# Patient Record
Sex: Female | Born: 1985
Health system: Southern US, Community
[De-identification: ages and names within clinical notes are randomized; demographics above are authoritative.]

## PROBLEM LIST (undated history)

## (undated) DIAGNOSIS — IMO0002 Reserved for concepts with insufficient information to code with codable children: Secondary | ICD-10-CM

## (undated) DIAGNOSIS — I428 Other cardiomyopathies: Secondary | ICD-10-CM

## (undated) DIAGNOSIS — I5022 Chronic systolic (congestive) heart failure: Secondary | ICD-10-CM

## (undated) DIAGNOSIS — B019 Varicella without complication: Secondary | ICD-10-CM

## (undated) DIAGNOSIS — I119 Hypertensive heart disease without heart failure: Secondary | ICD-10-CM

## (undated) DIAGNOSIS — I2510 Atherosclerotic heart disease of native coronary artery without angina pectoris: Secondary | ICD-10-CM

## (undated) DIAGNOSIS — J40 Bronchitis, not specified as acute or chronic: Secondary | ICD-10-CM

## (undated) DIAGNOSIS — I10 Essential (primary) hypertension: Secondary | ICD-10-CM

## (undated) HISTORY — DX: Morbid (severe) obesity due to excess calories: E66.01

## (undated) HISTORY — DX: Hypertensive heart disease without heart failure: I11.9

## (undated) HISTORY — PX: FINGER SURGERY: SHX640

## (undated) HISTORY — DX: Chronic systolic (congestive) heart failure: I50.22

## (undated) HISTORY — DX: Varicella without complication: B01.9

## (undated) HISTORY — DX: Atherosclerotic heart disease of native coronary artery without angina pectoris: I25.10

## (undated) HISTORY — DX: Other cardiomyopathies: I42.8

## (undated) HISTORY — DX: Bronchitis, not specified as acute or chronic: J40

---

## 2009-08-05 ENCOUNTER — Inpatient Hospital Stay (HOSPITAL_COMMUNITY): Admission: AD | Admit: 2009-08-05 | Discharge: 2009-08-05 | Payer: Self-pay | Admitting: Obstetrics

## 2009-08-18 ENCOUNTER — Inpatient Hospital Stay (HOSPITAL_COMMUNITY): Admission: AD | Admit: 2009-08-18 | Discharge: 2009-08-18 | Payer: Self-pay | Admitting: Obstetrics & Gynecology

## 2009-08-25 ENCOUNTER — Inpatient Hospital Stay (HOSPITAL_COMMUNITY): Admission: AD | Admit: 2009-08-25 | Discharge: 2009-08-25 | Payer: Self-pay | Admitting: Family Medicine

## 2009-09-05 ENCOUNTER — Ambulatory Visit (HOSPITAL_COMMUNITY): Admission: RE | Admit: 2009-09-05 | Discharge: 2009-09-05 | Payer: Self-pay | Admitting: Obstetrics & Gynecology

## 2011-02-01 ENCOUNTER — Inpatient Hospital Stay (HOSPITAL_COMMUNITY)
Admission: AD | Admit: 2011-02-01 | Discharge: 2011-02-01 | Disposition: A | Discharge status: Home / Self Care | Payer: Medicaid Other | Source: Ambulatory Visit | Attending: Obstetrics & Gynecology | Admitting: Obstetrics & Gynecology

## 2011-02-01 DIAGNOSIS — O210 Mild hyperemesis gravidarum: Secondary | ICD-10-CM | POA: Insufficient documentation

## 2011-02-01 LAB — URINALYSIS, ROUTINE W REFLEX MICROSCOPIC
Ketones, ur: NEGATIVE mg/dL
Nitrite: NEGATIVE

## 2011-02-01 LAB — POCT PREGNANCY, URINE: Preg Test, Ur: POSITIVE

## 2011-02-23 LAB — RPR: RPR: NONREACTIVE

## 2011-02-23 LAB — ANTIBODY SCREEN: Antibody Screen: NEGATIVE

## 2011-02-25 LAB — CBC
HCT: 33.5 % — ABNORMAL LOW (ref 36.0–46.0)
Hemoglobin: 10.8 g/dL — ABNORMAL LOW (ref 12.0–15.0)
MCHC: 32.3 g/dL (ref 30.0–36.0)
MCV: 78.4 fL (ref 78.0–100.0)
Platelets: 375 K/uL (ref 150–400)
RBC: 4.27 MIL/uL (ref 3.87–5.11)
RDW: 16.4 % — ABNORMAL HIGH (ref 11.5–15.5)
WBC: 8.4 K/uL (ref 4.0–10.5)

## 2011-02-25 LAB — HCG, QUANTITATIVE, PREGNANCY: hCG, Beta Chain, Quant, S: 104 m[IU]/mL — ABNORMAL HIGH (ref <5)

## 2011-02-26 LAB — URINALYSIS, ROUTINE W REFLEX MICROSCOPIC
Glucose, UA: NEGATIVE mg/dL
Ketones, ur: NEGATIVE mg/dL
Specific Gravity, Urine: <1.005 — ABNORMAL LOW (ref 1.005–1.030)
pH: 7 (ref 5.0–8.0)

## 2011-02-26 LAB — CBC
HCT: 33.9 % — ABNORMAL LOW (ref 36.0–46.0)
HCT: 34.2 % — ABNORMAL LOW (ref 36.0–46.0)
Hemoglobin: 10.9 g/dL — ABNORMAL LOW (ref 12.0–15.0)
MCHC: 32.1 g/dL (ref 30.0–36.0)
MCHC: 32 g/dL (ref 30.0–36.0)
MCV: 78.2 fL (ref 78.0–100.0)
Platelets: 427 10*3/uL — ABNORMAL HIGH (ref 150–400)
RBC: 4.37 MIL/uL (ref 3.87–5.11)
RDW: 15.6 % — ABNORMAL HIGH (ref 11.5–15.5)
WBC: 8.9 10*3/uL (ref 4.0–10.5)

## 2011-02-26 LAB — URINE MICROSCOPIC-ADD ON

## 2011-02-26 LAB — HCG, QUANTITATIVE, PREGNANCY: hCG, Beta Chain, Quant, S: 4664 m[IU]/mL — ABNORMAL HIGH (ref <5)

## 2011-02-26 LAB — WET PREP, GENITAL: Clue Cells Wet Prep HPF POC: NONE SEEN

## 2011-03-05 ENCOUNTER — Inpatient Hospital Stay (HOSPITAL_COMMUNITY): Payer: Medicaid Other

## 2011-03-05 ENCOUNTER — Inpatient Hospital Stay (HOSPITAL_COMMUNITY)
Admission: AD | Admit: 2011-03-05 | Discharge: 2011-03-05 | Disposition: A | Discharge status: Home / Self Care | Payer: Medicaid Other | Source: Ambulatory Visit | Attending: Obstetrics & Gynecology | Admitting: Obstetrics & Gynecology

## 2011-03-05 DIAGNOSIS — O209 Hemorrhage in early pregnancy, unspecified: Secondary | ICD-10-CM

## 2011-03-05 LAB — CBC
HCT: 35.4 % — ABNORMAL LOW (ref 36.0–46.0)
MCH: 25 pg — ABNORMAL LOW (ref 26.0–34.0)
MCHC: 33.3 g/dL (ref 30.0–36.0)
RBC: 4.72 MIL/uL (ref 3.87–5.11)
RDW: 13.5 % (ref 11.5–15.5)

## 2011-03-05 LAB — WET PREP, GENITAL

## 2011-03-16 ENCOUNTER — Inpatient Hospital Stay (HOSPITAL_COMMUNITY)
Admission: AD | Admit: 2011-03-16 | Discharge: 2011-03-16 | Disposition: A | Discharge status: Home / Self Care | Payer: Medicaid Other | Source: Ambulatory Visit | Attending: Obstetrics | Admitting: Obstetrics

## 2011-03-16 DIAGNOSIS — O9989 Other specified diseases and conditions complicating pregnancy, childbirth and the puerperium: Secondary | ICD-10-CM

## 2011-03-16 DIAGNOSIS — O99891 Other specified diseases and conditions complicating pregnancy: Secondary | ICD-10-CM | POA: Insufficient documentation

## 2011-03-16 DIAGNOSIS — R51 Headache: Secondary | ICD-10-CM | POA: Insufficient documentation

## 2011-04-15 ENCOUNTER — Other Ambulatory Visit: Payer: Self-pay | Admitting: Obstetrics

## 2011-04-15 DIAGNOSIS — O10919 Unspecified pre-existing hypertension complicating pregnancy, unspecified trimester: Secondary | ICD-10-CM

## 2011-04-28 ENCOUNTER — Ambulatory Visit (HOSPITAL_COMMUNITY): Payer: Medicaid Other

## 2011-08-19 ENCOUNTER — Encounter (HOSPITAL_COMMUNITY): Payer: Self-pay

## 2011-08-19 ENCOUNTER — Inpatient Hospital Stay (HOSPITAL_COMMUNITY)
Admission: AD | Admit: 2011-08-19 | Discharge: 2011-08-19 | Disposition: A | Discharge status: Home / Self Care | Payer: Medicaid Other | Source: Ambulatory Visit | Attending: Obstetrics & Gynecology | Admitting: Obstetrics & Gynecology

## 2011-08-19 DIAGNOSIS — O10019 Pre-existing essential hypertension complicating pregnancy, unspecified trimester: Secondary | ICD-10-CM | POA: Insufficient documentation

## 2011-08-19 DIAGNOSIS — O10919 Unspecified pre-existing hypertension complicating pregnancy, unspecified trimester: Secondary | ICD-10-CM

## 2011-08-19 HISTORY — DX: Essential (primary) hypertension: I10

## 2011-08-19 LAB — URINALYSIS, ROUTINE W REFLEX MICROSCOPIC
Ketones, ur: NEGATIVE mg/dL
Leukocytes, UA: NEGATIVE
Nitrite: NEGATIVE
Protein, ur: NEGATIVE mg/dL
Urobilinogen, UA: 0.2 mg/dL (ref 0.0–1.0)

## 2011-08-19 LAB — CBC
HCT: 31.3 % — ABNORMAL LOW (ref 36.0–46.0)
RDW: 13 % (ref 11.5–15.5)

## 2011-08-19 LAB — COMPREHENSIVE METABOLIC PANEL
ALT: 8 U/L (ref 0–35)
Creatinine, Ser: <0.47 mg/dL — ABNORMAL LOW (ref 0.50–1.10)
Total Bilirubin: 0.4 mg/dL (ref 0.3–1.2)

## 2011-08-19 LAB — URIC ACID: Uric Acid, Serum: 5.1 mg/dL (ref 2.4–7.0)

## 2011-08-19 LAB — LACTATE DEHYDROGENASE: LDH: 130 U/L (ref 94–250)

## 2011-08-19 MED ORDER — LACTATED RINGERS IV BOLUS (SEPSIS)
1000.0000 mL | Freq: Once | INTRAVENOUS | Status: AC
  Administered 2011-08-19: 1000 mL via INTRAVENOUS

## 2011-08-19 NOTE — ED Provider Notes (Signed)
History   Pt presents today for preeclamptic workup. She is a chronic hypertensive and has been having a HA. She states her HA has now resolved. She is currently 25.0wks. She reports GFM and denies vag dc or bleeding.  Chief Complaint  Patient presents with  . Headache   HPI  OB History    Grav Para Term Preterm Abortions TAB SAB Ect Mult Living   2    1  1          Past Medical History  Diagnosis Date  . Hypertension     Past Surgical History  Procedure Date  . Finger surgery     No family history on file.  History  Substance Use Topics  . Smoking status: Never Smoker   . Smokeless tobacco: Not on file  . Alcohol Use: No    Allergies: No Known Allergies  Prescriptions prior to admission  Medication Sig Dispense Refill  . amLODipine (NORVASC) 5 MG tablet Take 5 mg by mouth daily.        . methyldopa (ALDOMET) 250 MG tablet Take 250 mg by mouth 3 (three) times daily.          Review of Systems  Constitutional: Negative for fever.  Eyes: Negative for blurred vision and double vision.  Cardiovascular: Negative for chest pain and palpitations.  Gastrointestinal: Negative for nausea, vomiting, abdominal pain, diarrhea and constipation.  Genitourinary: Negative for dysuria, urgency, frequency and hematuria.  Neurological: Positive for headaches. Negative for dizziness.  Psychiatric/Behavioral: Negative for depression and suicidal ideas.   Physical Exam   Blood pressure 129/77, pulse 77, temperature 99.1 F (37.3 C), temperature source Oral, height 5\' 5"  (1.651 m), weight 250 lb 6.4 oz (113.581 kg), SpO2 99.00%.  Physical Exam  Constitutional: She is oriented to person, place, and time. She appears well-developed and well-nourished. No distress.  HENT:  Head: Normocephalic and atraumatic.  Eyes: EOM are normal. Pupils are equal, round, and reactive to light.  GI: Soft. She exhibits no distension. There is no tenderness. There is no rebound and no guarding.    Neurological: She is alert and oriented to person, place, and time. She has normal reflexes.  Skin: Skin is warm and dry. She is not diaphoretic.  Psychiatric: She has a normal mood and affect. Her behavior is normal. Judgment and thought content normal.    MAU Course  Procedures  Discussed pt with Dr. Tamela Oddi. Will do PIH labs. Pt care turned over to [facility], FNP at 7:53pm.  Clinton Gallant. Rice III, DrHSc, MPAS, PA-C  08/19/2011, 7:13 PM   Results for orders placed during the hospital encounter of 08/19/11 (from the past 24 hour(s))  URINALYSIS, ROUTINE W REFLEX MICROSCOPIC     Status: Normal   Collection Time   08/19/11  6:25 PM      Component Value Range   Color, Urine YELLOW  YELLOW    Appearance CLEAR  CLEAR    Specific Gravity, Urine 1.020  1.005 - 1.030    pH 6.5  5.0 - 8.0    Glucose, UA NEGATIVE  NEGATIVE (mg/dL)   Hgb urine dipstick NEGATIVE  NEGATIVE    Bilirubin Urine NEGATIVE  NEGATIVE    Ketones, ur NEGATIVE  NEGATIVE (mg/dL)   Protein, ur NEGATIVE  NEGATIVE (mg/dL)   Urobilinogen, UA 0.2  0.0 - 1.0 (mg/dL)   Nitrite NEGATIVE  NEGATIVE    Leukocytes, UA NEGATIVE  NEGATIVE   CBC     Status: Abnormal  Collection Time   08/19/11  7:14 PM      Component Value Range   WBC 10.3  4.0 - 10.5 (K/uL)   RBC 4.04  3.87 - 5.11 (MIL/uL)   Hemoglobin 10.8 (*) 12.0 - 15.0 (g/dL)   HCT 29.5 (*) 28.4 - 46.0 (%)   MCV 77.5 (*) 78.0 - 100.0 (fL)   MCH 26.7  26.0 - 34.0 (pg)   MCHC 34.5  30.0 - 36.0 (g/dL)   RDW 13.2  44.0 - 10.2 (%)   Platelets 270  150 - 400 (K/uL)  COMPREHENSIVE METABOLIC PANEL     Status: Abnormal   Collection Time   08/19/11  7:14 PM      Component Value Range   Sodium 133 (*) 135 - 145 (mEq/L)   Potassium 3.8  3.5 - 5.1 (mEq/L)   Chloride 102  96 - 112 (mEq/L)   CO2 23  19 - 32 (mEq/L)   Glucose, Bld 79  70 - 99 (mg/dL)   BUN 6  6 - 23 (mg/dL)   Creatinine, Ser <7.25 (*) 0.50 - 1.10 (mg/dL)   Calcium 9.5  8.4 - 36.6 (mg/dL)   Total Protein  6.6  6.0 - 8.3 (g/dL)   Albumin 2.6 (*) 3.5 - 5.2 (g/dL)   AST 9  0 - 37 (U/L)   ALT 8  0 - 35 (U/L)   Alkaline Phosphatase 76  39 - 117 (U/L)   Total Bilirubin 0.4  0.3 - 1.2 (mg/dL)   GFR calc non Af Amer NOT CALCULATED  >60 (mL/min)   GFR calc Af Amer NOT CALCULATED  >60 (mL/min)  LACTATE DEHYDROGENASE     Status: Normal   Collection Time   08/19/11  7:14 PM      Component Value Range   LD 130  94 - 250 (U/L)  URIC ACID     Status: Normal   Collection Time   08/19/11  7:14 PM      Component Value Range   Uric Acid, Serum 5.1  2.4 - 7.0 (mg/dL)   BPs decreased to 440H/47Q. Pt asymptomatic, DTRs WNL.   NST reactive. Per Dr. Tamela Oddi, pt may be d/c'd to home with precautions. Has f/u scheduled for tomorrow.   Assessment and Plan  25 y.o. G2P0010 at [redacted]w[redacted]d CHTN in pregnancy  F/U tomorrow in office as instructed Precautions rev'd    Henrietta Hoover, [place] 08/19/11 1953

## 2011-08-19 NOTE — Progress Notes (Signed)
LABS DRAWN

## 2011-08-19 NOTE — Progress Notes (Signed)
Pt states she has chronic hypertension. Was in the office yesterday and had labs done. The office called her today and told her to come to MAU for evaluation. Pt states she has had a slight headache today.

## 2011-08-19 NOTE — Progress Notes (Signed)
D/C HOME-  HAD FED WITH   PB AND CRACKERS- FELT BETTER.

## 2011-09-13 ENCOUNTER — Encounter (HOSPITAL_COMMUNITY): Payer: Self-pay | Admitting: *Deleted

## 2011-09-13 ENCOUNTER — Telehealth (HOSPITAL_COMMUNITY): Payer: Self-pay | Admitting: *Deleted

## 2011-09-13 NOTE — Telephone Encounter (Signed)
Preadmission screen  

## 2011-09-16 ENCOUNTER — Ambulatory Visit
Admission: RE | Admit: 2011-09-16 | Discharge: 2011-09-16 | Disposition: A | Discharge status: Home / Self Care | Payer: Medicaid Other | Source: Ambulatory Visit | Attending: Obstetrics & Gynecology | Admitting: Obstetrics & Gynecology

## 2011-09-16 ENCOUNTER — Other Ambulatory Visit: Payer: Self-pay | Admitting: Obstetrics & Gynecology

## 2011-09-16 DIAGNOSIS — R52 Pain, unspecified: Secondary | ICD-10-CM

## 2011-09-19 ENCOUNTER — Inpatient Hospital Stay (HOSPITAL_COMMUNITY)
Admission: RE | Admit: 2011-09-19 | Discharge: 2011-09-24 | DRG: 766 | Disposition: A | Discharge status: Home / Self Care | Payer: Medicaid Other | Source: Ambulatory Visit | Attending: Obstetrics & Gynecology | Admitting: Obstetrics & Gynecology

## 2011-09-19 ENCOUNTER — Encounter (HOSPITAL_COMMUNITY): Payer: Medicaid Other

## 2011-09-19 ENCOUNTER — Encounter (HOSPITAL_COMMUNITY): Payer: Self-pay

## 2011-09-19 DIAGNOSIS — Z22330 Carrier of Group B streptococcus: Secondary | ICD-10-CM

## 2011-09-19 DIAGNOSIS — O99892 Other specified diseases and conditions complicating childbirth: Secondary | ICD-10-CM | POA: Diagnosis present

## 2011-09-19 DIAGNOSIS — O1002 Pre-existing essential hypertension complicating childbirth: Principal | ICD-10-CM | POA: Diagnosis present

## 2011-09-19 DIAGNOSIS — IMO0002 Reserved for concepts with insufficient information to code with codable children: Secondary | ICD-10-CM | POA: Diagnosis not present

## 2011-09-19 DIAGNOSIS — D649 Anemia, unspecified: Secondary | ICD-10-CM | POA: Diagnosis not present

## 2011-09-19 DIAGNOSIS — O9903 Anemia complicating the puerperium: Secondary | ICD-10-CM | POA: Diagnosis not present

## 2011-09-19 HISTORY — DX: Reserved for concepts with insufficient information to code with codable children: IMO0002

## 2011-09-19 LAB — CBC
HCT: 33.6 % — ABNORMAL LOW (ref 36.0–46.0)
MCV: 76.9 fL — ABNORMAL LOW (ref 78.0–100.0)
RBC: 4.37 MIL/uL (ref 3.87–5.11)
WBC: 10.3 10*3/uL (ref 4.0–10.5)

## 2011-09-19 MED ORDER — OXYTOCIN 20 UNITS IN LACTATED RINGERS INFUSION - SIMPLE
1.0000 m[IU]/min | INTRAVENOUS | Status: DC
  Administered 2011-09-19: 1 m[IU]/min via INTRAVENOUS

## 2011-09-19 MED ORDER — CITRIC ACID-SODIUM CITRATE 334-500 MG/5ML PO SOLN
30.0000 mL | ORAL | Status: DC | PRN
  Administered 2011-09-21: 30 mL via ORAL
  Filled 2011-09-19 (×2): qty 15

## 2011-09-19 MED ORDER — OXYTOCIN BOLUS FROM INFUSION
500.0000 mL | Freq: Once | INTRAVENOUS | Status: DC
  Filled 2011-09-19: qty 500

## 2011-09-19 MED ORDER — PROMETHAZINE HCL 25 MG/ML IJ SOLN: 12.5000 mg | INTRAMUSCULAR | Status: DC | PRN

## 2011-09-19 MED ORDER — TERBUTALINE SULFATE 1 MG/ML IJ SOLN: 0.2500 mg | Freq: Once | INTRAMUSCULAR | Status: AC | PRN

## 2011-09-19 MED ORDER — METHYLDOPA 250 MG PO TABS
250.0000 mg | ORAL_TABLET | Freq: Three times a day (TID) | ORAL | Status: DC
  Administered 2011-09-19 – 2011-09-23 (×11): 250 mg via ORAL
  Filled 2011-09-19 (×14): qty 1

## 2011-09-19 MED ORDER — LIDOCAINE HCL (PF) 1 % IJ SOLN
30.0000 mL | INTRAMUSCULAR | Status: DC | PRN
  Filled 2011-09-19: qty 30

## 2011-09-19 MED ORDER — LACTATED RINGERS IV SOLN
500.0000 mL | INTRAVENOUS | Status: DC | PRN
Start: 2011-09-19 — End: 2011-09-21
  Administered 2011-09-20: 1000 mL via INTRAVENOUS

## 2011-09-19 MED ORDER — ONDANSETRON HCL 4 MG/2ML IJ SOLN
4.0000 mg | Freq: Four times a day (QID) | INTRAMUSCULAR | Status: DC | PRN
  Administered 2011-09-20 (×2): 4 mg via INTRAVENOUS
  Filled 2011-09-19 (×2): qty 2

## 2011-09-19 MED ORDER — BUTORPHANOL TARTRATE 2 MG/ML IJ SOLN
1.0000 mg | INTRAMUSCULAR | Status: DC | PRN
  Administered 2011-09-20 (×2): 1 mg via INTRAVENOUS
  Filled 2011-09-19 (×2): qty 1

## 2011-09-19 MED ORDER — IBUPROFEN 600 MG PO TABS: 600.0000 mg | ORAL_TABLET | Freq: Four times a day (QID) | ORAL | Status: DC | PRN

## 2011-09-19 MED ORDER — PENICILLIN G POTASSIUM 5000000 UNITS IJ SOLR
2.5000 10*6.[IU] | INTRAMUSCULAR | Status: DC
  Administered 2011-09-20 – 2011-09-21 (×8): 2.5 10*6.[IU] via INTRAVENOUS
  Filled 2011-09-19 (×11): qty 2.5

## 2011-09-19 MED ORDER — DEXTROSE 5 % IV SOLN
5.0000 10*6.[IU] | Freq: Once | INTRAVENOUS | Status: AC
  Administered 2011-09-19: 5 10*6.[IU] via INTRAVENOUS
  Filled 2011-09-19: qty 5

## 2011-09-19 MED ORDER — FLEET ENEMA 7-19 GM/118ML RE ENEM: 1.0000 | ENEMA | RECTAL | Status: DC | PRN

## 2011-09-19 MED ORDER — ACETAMINOPHEN 325 MG PO TABS: 650.0000 mg | ORAL_TABLET | ORAL | Status: DC | PRN

## 2011-09-19 MED ORDER — OXYCODONE-ACETAMINOPHEN 5-325 MG PO TABS: 2.0000 | ORAL_TABLET | ORAL | Status: DC | PRN

## 2011-09-19 MED ORDER — OXYTOCIN 20 UNITS IN LACTATED RINGERS INFUSION - SIMPLE
125.0000 mL/h | Freq: Once | INTRAVENOUS | Status: DC
  Filled 2011-09-19: qty 1000

## 2011-09-19 MED ORDER — LACTATED RINGERS IV SOLN
INTRAVENOUS | Status: DC
  Administered 2011-09-19 (×2): via INTRAVENOUS
  Administered 2011-09-20: 95 mL/h via INTRAVENOUS
  Administered 2011-09-20: 12:00:00 via INTRAVENOUS
  Administered 2011-09-21: 1000 mL via INTRAVENOUS
  Administered 2011-09-21: 119 mL/h via INTRAVENOUS

## 2011-09-19 NOTE — H&P (Signed)
This is Dr. Francoise Ceo dictating the history and physical on Patricia Reynolds she is a gravida 2 para 0010 at 39 weeks and 3 days of chronic hypertensive who was EDC was 09/23/2011 she is a positive GBS and was admitted for induction of labor during this pregnancy she has been on Norvasc 5 mg by mouth daily and Aldomet 250 mg by mouth Q8 hours her GBS is also positive and she received penicillin she had been followed by MFM throat the pregnancy and her cervix is 2 cm 90% with the vertex at a -2 station amniotomy was not performed and she is contracting on her own every 5 minutes Past surgical history negative Past medical history hypertension Traube this pregnancy social history noncontributory System review negative Physical exam well-developed female in no distress HEENT negative Lungs clear Heart regular rhythm no murmurs no gallops breasts no masses abdomen term Pelvic as described above extremities negative and and

## 2011-09-19 NOTE — Progress Notes (Signed)
Dr Gaynell Face aware of pts arrival. Requesting orders. MD is in an emergency will call back with orders.

## 2011-09-19 NOTE — Progress Notes (Signed)
MD called back with POC and orders for induction.

## 2011-09-20 LAB — CBC
HCT: 31.5 % — ABNORMAL LOW (ref 36.0–46.0)
Hemoglobin: 10.5 g/dL — ABNORMAL LOW (ref 12.0–15.0)
RBC: 4.07 MIL/uL (ref 3.87–5.11)
RDW: 13.3 % (ref 11.5–15.5)
WBC: 8.9 10*3/uL (ref 4.0–10.5)

## 2011-09-20 MED ORDER — LIDOCAINE HCL 1.5 % IJ SOLN
INTRAMUSCULAR | Status: DC | PRN
  Administered 2011-09-20: 5 mL via INTRADERMAL
  Administered 2011-09-20: 2 mL via INTRADERMAL

## 2011-09-20 MED ORDER — EPHEDRINE 5 MG/ML INJ
10.0000 mg | INTRAVENOUS | Status: DC | PRN
  Filled 2011-09-20 (×2): qty 4

## 2011-09-20 MED ORDER — DIPHENHYDRAMINE HCL 50 MG/ML IJ SOLN: 12.5000 mg | INTRAMUSCULAR | Status: DC | PRN

## 2011-09-20 MED ORDER — EPHEDRINE 5 MG/ML INJ
10.0000 mg | INTRAVENOUS | Status: DC | PRN
  Filled 2011-09-20: qty 4

## 2011-09-20 MED ORDER — OXYTOCIN 20 UNITS IN LACTATED RINGERS INFUSION - SIMPLE: 1.0000 m[IU]/min | INTRAVENOUS | Status: DC

## 2011-09-20 MED ORDER — PHENYLEPHRINE 40 MCG/ML (10ML) SYRINGE FOR IV PUSH (FOR BLOOD PRESSURE SUPPORT)
80.0000 ug | PREFILLED_SYRINGE | INTRAVENOUS | Status: DC | PRN
  Filled 2011-09-20: qty 5

## 2011-09-20 MED ORDER — LACTATED RINGERS IV SOLN
500.0000 mL | Freq: Once | INTRAVENOUS | Status: AC
  Administered 2011-09-20: 1000 mL via INTRAVENOUS

## 2011-09-20 MED ORDER — AMLODIPINE BESYLATE 5 MG PO TABS
5.0000 mg | ORAL_TABLET | Freq: Every day | ORAL | Status: DC
  Administered 2011-09-20 – 2011-09-23 (×3): 5 mg via ORAL
  Filled 2011-09-20 (×5): qty 1

## 2011-09-20 MED ORDER — LABETALOL HCL 5 MG/ML IV SOLN
20.0000 mg | Freq: Once | INTRAVENOUS | Status: AC
  Administered 2011-09-20: 20 mg via INTRAVENOUS
  Filled 2011-09-20: qty 4

## 2011-09-20 MED ORDER — PHENYLEPHRINE 40 MCG/ML (10ML) SYRINGE FOR IV PUSH (FOR BLOOD PRESSURE SUPPORT)
80.0000 ug | PREFILLED_SYRINGE | INTRAVENOUS | Status: DC | PRN
  Filled 2011-09-20 (×2): qty 5

## 2011-09-20 MED ORDER — FENTANYL 2.5 MCG/ML BUPIVACAINE 1/10 % EPIDURAL INFUSION (WH - ANES)
14.0000 mL/h | INTRAMUSCULAR | Status: DC
  Administered 2011-09-20 – 2011-09-21 (×3): 14 mL/h via EPIDURAL
  Filled 2011-09-20 (×3): qty 60

## 2011-09-20 MED ORDER — LACTATED RINGERS IV SOLN
INTRAVENOUS | Status: DC
  Administered 2011-09-20: 500 mL/h via INTRAUTERINE
  Administered 2011-09-21: 150 mL/h via INTRAUTERINE

## 2011-09-20 NOTE — Progress Notes (Signed)
Patricia Reynolds is a 25 y.o. G2P0010 at [redacted]w[redacted]d by LMP admitted for induction of labor due to Hypertension.  Subjective:   Objective: BP 125/75  Pulse 82  Temp(Src) 98.4 F (36.9 C) (Oral)  Resp 18  Ht 5\' 5"  (1.651 m)  Wt 113.399 kg (250 lb)  BMI 41.60 kg/m2      FHT:  FHR: 140 bpm, variability: moderate,  accelerations:  Present,  decelerations:  Absent UC:   irregular, every 5 minutes SVE:   Dilation: 2.5 Effacement (%): 60 Station: -2 Exam by:: AGarcia RN  Labs: Lab Results  Component Value Date   WBC 10.3 09/19/2011   HGB 11.2* 09/19/2011   HCT 33.6* 09/19/2011   MCV 76.9* 09/19/2011   PLT 330 09/19/2011    Assessment / Plan: IOL secondary to chronic HTN  Labor: Prodromal labor Preeclampsia:  ?mild preeclampsia Fetal Wellbeing:  Category I Pain Control:  Labor support without medications I/D:  n/a Anticipated MOD:  NSVD  JACKSON-MOORE,Rowland Ericsson A 09/20/2011, 9:28 AM

## 2011-09-20 NOTE — Progress Notes (Signed)
Patricia Reynolds is a 25 y.o. G2P0010 at [redacted]w[redacted]d by LMP admitted for induction of labor due to Hypertension.  Subjective:   Objective: BP 151/75  Pulse 74  Temp(Src) 98.3 F (36.8 C) (Oral)  Resp 18  Ht 5\' 5"  (1.651 m)  Wt 113.399 kg (250 lb)  BMI 41.60 kg/m2 I/O last 3 completed shifts: In: -  Out: 350 [Emesis/NG output:350]    FHT:  FHR: 140 bpm, variability: moderate,  accelerations:  Present,  decelerations:  Absent UC:   regular, every 2 minutes SVE:   Dilation: 5 Effacement (%): 90 Station: -2 Exam by:: D. brown RNC  Labs: Lab Results  Component Value Date   WBC 8.9 09/20/2011   HGB 10.5* 09/20/2011   HCT 31.5* 09/20/2011   MCV 77.4* 09/20/2011   PLT 259 09/20/2011    Assessment / Plan: Induction of labor due to hypertension,  progressing well on pitocin  Labor: Progressing on Pitocin, will continue to increase then AROM Preeclampsia:  No criteria for severe, superimposed Fetal Wellbeing:  Category I Pain Control:  an Epidural is planned I/D:  n/a Anticipated MOD:  NSVD  JACKSON-MOORE,Akeelah Seppala A 09/20/2011, 9:24 PM

## 2011-09-20 NOTE — Progress Notes (Signed)
Made aware of epidural request. Will repeat CBC.

## 2011-09-20 NOTE — Progress Notes (Signed)
In to evaluate patient progress

## 2011-09-20 NOTE — Anesthesia Preprocedure Evaluation (Signed)
Anesthesia Evaluation  Patient identified by MRN, date of birth, ID band Patient awake  General Assessment Comment  Reviewed: Allergy & Precautions, H&P , NPO status , Patient's Chart, lab work & pertinent test results, reviewed documented beta blocker date and time   History of Anesthesia Complications Negative for: history of anesthetic complications  Airway Mallampati: III TM Distance: >3 FB Neck ROM: full    Dental  (+) Teeth Intact   Pulmonary  clear to auscultation        Cardiovascular hypertension (chronic, on aldomet and norvasc), regular Normal    Neuro/Psych Negative Neurological ROS  Negative Psych ROS   GI/Hepatic Neg liver ROS  GERD Medicated  Endo/Other  Morbid obesity  Renal/GU negative Renal ROS     Musculoskeletal   Abdominal   Peds  Hematology negative hematology ROS (+)   Anesthesia Other Findings   Reproductive/Obstetrics (+) Pregnancy                           Anesthesia Physical Anesthesia Plan  ASA: III  Anesthesia Plan: Epidural   Post-op Pain Management:    Induction:   Airway Management Planned:   Additional Equipment:   Intra-op Plan:   Post-operative Plan:   Informed Consent: I have reviewed the patients History and Physical, chart, labs and discussed the procedure including the risks, benefits and alternatives for the proposed anesthesia with the patient or authorized representative who has indicated his/her understanding and acceptance.     Plan Discussed with:   Anesthesia Plan Comments:         Anesthesia Quick Evaluation

## 2011-09-20 NOTE — Progress Notes (Signed)
MD made aware of SVE, contraction pattern, FH tracing and Pitocin settings. New orders received to decrease Pitocin by 1 milliunit/min for tachysystole.

## 2011-09-20 NOTE — Anesthesia Procedure Notes (Signed)
Epidural Patient location during procedure: OB Start time: 09/20/2011 9:28 PM Reason for block: procedure for pain  Staffing Performed by: anesthesiologist   Preanesthetic Checklist Completed: patient identified, site marked, surgical consent, pre-op evaluation, timeout performed, IV checked, risks and benefits discussed and monitors and equipment checked  Epidural Patient position: sitting Prep: site prepped and draped and DuraPrep Patient monitoring: continuous pulse ox and blood pressure Approach: midline Injection technique: LOR air  Needle:  Needle type: Tuohy  Needle gauge: 17 G Needle length: 9 cm Needle insertion depth: 7 cm Catheter type: closed end flexible Catheter size: 19 Gauge Catheter at skin depth: 12 cm Test dose: negative  Assessment Events: blood not aspirated, injection not painful, no injection resistance, negative IV test and no paresthesia  Additional Notes Discussed risk of headache, infection, bleeding, nerve injury and failed or incomplete block.  Patient voices understanding and wishes to proceed.

## 2011-09-21 ENCOUNTER — Other Ambulatory Visit: Payer: Self-pay | Admitting: Obstetrics & Gynecology

## 2011-09-21 ENCOUNTER — Encounter (HOSPITAL_COMMUNITY): Payer: Self-pay | Admitting: Anesthesiology

## 2011-09-21 ENCOUNTER — Inpatient Hospital Stay (HOSPITAL_COMMUNITY): Payer: Medicaid Other | Admitting: Anesthesiology

## 2011-09-21 ENCOUNTER — Encounter (HOSPITAL_COMMUNITY)
Admission: RE | Disposition: A | Discharge status: Home / Self Care | Payer: Self-pay | Source: Ambulatory Visit | Attending: Obstetrics & Gynecology

## 2011-09-21 ENCOUNTER — Encounter (HOSPITAL_COMMUNITY): Payer: Self-pay

## 2011-09-21 LAB — CBC
Hemoglobin: 10 g/dL — ABNORMAL LOW (ref 12.0–15.0)
MCHC: 33.1 g/dL (ref 30.0–36.0)
RDW: 13.4 % (ref 11.5–15.5)

## 2011-09-21 SURGERY — Surgical Case
Anesthesia: Regional | Site: Uterus | Wound class: Clean Contaminated

## 2011-09-21 MED ORDER — NALOXONE HCL 0.4 MG/ML IJ SOLN: 0.4000 mg | INTRAMUSCULAR | Status: DC | PRN

## 2011-09-21 MED ORDER — MORPHINE SULFATE (PF) 0.5 MG/ML IJ SOLN
INTRAMUSCULAR | Status: DC | PRN
  Administered 2011-09-21: 1 mg via INTRAVENOUS

## 2011-09-21 MED ORDER — OXYTOCIN 20 UNITS IN LACTATED RINGERS INFUSION - SIMPLE
INTRAVENOUS | Status: AC
  Filled 2011-09-21: qty 1000

## 2011-09-21 MED ORDER — OXYTOCIN 20 UNITS IN LACTATED RINGERS INFUSION - SIMPLE
INTRAVENOUS | Status: DC | PRN
  Administered 2011-09-21: 20 [IU] via INTRAVENOUS

## 2011-09-21 MED ORDER — KETOROLAC TROMETHAMINE 60 MG/2ML IM SOLN
60.0000 mg | Freq: Once | INTRAMUSCULAR | Status: AC | PRN
  Administered 2011-09-21: 60 mg via INTRAMUSCULAR

## 2011-09-21 MED ORDER — KETOROLAC TROMETHAMINE 60 MG/2ML IM SOLN
INTRAMUSCULAR | Status: AC
  Administered 2011-09-21: 60 mg via INTRAMUSCULAR
  Filled 2011-09-21: qty 2

## 2011-09-21 MED ORDER — OXYTOCIN 20 UNITS IN LACTATED RINGERS INFUSION - SIMPLE
125.0000 mL/h | INTRAVENOUS | Status: DC
  Administered 2011-09-21: 125 mL/h via INTRAVENOUS

## 2011-09-21 MED ORDER — DIPHENHYDRAMINE HCL 25 MG PO CAPS: 25.0000 mg | ORAL_CAPSULE | ORAL | Status: DC | PRN

## 2011-09-21 MED ORDER — NALBUPHINE HCL 10 MG/ML IJ SOLN: 5.0000 mg | INTRAMUSCULAR | Status: DC | PRN

## 2011-09-21 MED ORDER — BUPIVACAINE HCL (PF) 0.25 % IJ SOLN
INTRAMUSCULAR | Status: DC | PRN
  Administered 2011-09-21 (×2): 5 mL via EPIDURAL

## 2011-09-21 MED ORDER — ONDANSETRON HCL 4 MG/2ML IJ SOLN
4.0000 mg | Freq: Three times a day (TID) | INTRAMUSCULAR | Status: DC | PRN
  Filled 2011-09-21: qty 2

## 2011-09-21 MED ORDER — DIPHENHYDRAMINE HCL 50 MG/ML IJ SOLN: 25.0000 mg | INTRAMUSCULAR | Status: DC | PRN

## 2011-09-21 MED ORDER — SODIUM BICARBONATE 8.4 % IV SOLN
INTRAVENOUS | Status: DC | PRN
  Administered 2011-09-21: 5 mL via EPIDURAL

## 2011-09-21 MED ORDER — SENNOSIDES-DOCUSATE SODIUM 8.6-50 MG PO TABS
2.0000 | ORAL_TABLET | Freq: Every day | ORAL | Status: DC
  Administered 2011-09-21 – 2011-09-22 (×2): 2 via ORAL

## 2011-09-21 MED ORDER — MEASLES, MUMPS & RUBELLA VAC ~~LOC~~ INJ
0.5000 mL | INJECTION | Freq: Once | SUBCUTANEOUS | Status: DC
  Filled 2011-09-21: qty 0.5

## 2011-09-21 MED ORDER — MEPERIDINE HCL 25 MG/ML IJ SOLN
6.2500 mg | INTRAMUSCULAR | Status: DC | PRN
  Administered 2011-09-21: 12.5 mg via INTRAVENOUS

## 2011-09-21 MED ORDER — SCOPOLAMINE 1 MG/3DAYS TD PT72
MEDICATED_PATCH | TRANSDERMAL | Status: AC
  Administered 2011-09-21: 1.5 mg
  Filled 2011-09-21: qty 1

## 2011-09-21 MED ORDER — ONDANSETRON HCL 4 MG PO TABS: 4.0000 mg | ORAL_TABLET | ORAL | Status: DC | PRN

## 2011-09-21 MED ORDER — ZOLPIDEM TARTRATE 5 MG PO TABS: 5.0000 mg | ORAL_TABLET | Freq: Every evening | ORAL | Status: DC | PRN

## 2011-09-21 MED ORDER — IBUPROFEN 600 MG PO TABS
600.0000 mg | ORAL_TABLET | Freq: Four times a day (QID) | ORAL | Status: DC
  Administered 2011-09-21 – 2011-09-24 (×11): 600 mg via ORAL
  Filled 2011-09-21 (×11): qty 1

## 2011-09-21 MED ORDER — FENTANYL CITRATE 0.05 MG/ML IJ SOLN: 100.0000 ug | Freq: Once | INTRAMUSCULAR | Status: DC

## 2011-09-21 MED ORDER — SIMETHICONE 80 MG PO CHEW
80.0000 mg | CHEWABLE_TABLET | ORAL | Status: DC | PRN
  Administered 2011-09-21 – 2011-09-23 (×6): 80 mg via ORAL

## 2011-09-21 MED ORDER — WITCH HAZEL-GLYCERIN EX PADS: 1.0000 "application " | MEDICATED_PAD | CUTANEOUS | Status: DC | PRN

## 2011-09-21 MED ORDER — LANOLIN HYDROUS EX OINT: 1.0000 "application " | TOPICAL_OINTMENT | CUTANEOUS | Status: DC | PRN

## 2011-09-21 MED ORDER — TETANUS-DIPHTH-ACELL PERTUSSIS 5-2.5-18.5 LF-MCG/0.5 IM SUSP
0.5000 mL | Freq: Once | INTRAMUSCULAR | Status: DC
  Filled 2011-09-21: qty 0.5

## 2011-09-21 MED ORDER — ONDANSETRON HCL 4 MG/2ML IJ SOLN
4.0000 mg | INTRAMUSCULAR | Status: DC | PRN
  Administered 2011-09-21: 4 mg via INTRAVENOUS

## 2011-09-21 MED ORDER — FERROUS SULFATE 325 (65 FE) MG PO TABS
325.0000 mg | ORAL_TABLET | Freq: Two times a day (BID) | ORAL | Status: DC
  Administered 2011-09-22 – 2011-09-23 (×4): 325 mg via ORAL
  Filled 2011-09-21 (×4): qty 1

## 2011-09-21 MED ORDER — KETOROLAC TROMETHAMINE 30 MG/ML IJ SOLN: 30.0000 mg | Freq: Four times a day (QID) | INTRAMUSCULAR | Status: AC | PRN

## 2011-09-21 MED ORDER — LACTATED RINGERS IV SOLN
INTRAVENOUS | Status: DC
  Administered 2011-09-21: 17:00:00 via INTRAVENOUS

## 2011-09-21 MED ORDER — MAGNESIUM HYDROXIDE 400 MG/5ML PO SUSP: 30.0000 mL | ORAL | Status: DC | PRN

## 2011-09-21 MED ORDER — CHLOROPROCAINE HCL 3 % IJ SOLN
INTRAMUSCULAR | Status: AC
  Filled 2011-09-21: qty 20

## 2011-09-21 MED ORDER — ONDANSETRON HCL 4 MG/2ML IJ SOLN
INTRAMUSCULAR | Status: AC
  Filled 2011-09-21: qty 2

## 2011-09-21 MED ORDER — DIPHENHYDRAMINE HCL 25 MG PO CAPS: 25.0000 mg | ORAL_CAPSULE | Freq: Four times a day (QID) | ORAL | Status: DC | PRN

## 2011-09-21 MED ORDER — FENTANYL CITRATE 0.05 MG/ML IJ SOLN
INTRAMUSCULAR | Status: DC | PRN
  Administered 2011-09-21: 100 ug via EPIDURAL

## 2011-09-21 MED ORDER — FENTANYL CITRATE 0.05 MG/ML IJ SOLN
INTRAMUSCULAR | Status: AC
  Filled 2011-09-21: qty 2

## 2011-09-21 MED ORDER — TERBUTALINE SULFATE 1 MG/ML IJ SOLN: 0.2500 mg | Freq: Once | INTRAMUSCULAR | Status: DC | PRN

## 2011-09-21 MED ORDER — OXYTOCIN 10 UNIT/ML IJ SOLN
INTRAMUSCULAR | Status: AC
  Filled 2011-09-21: qty 2

## 2011-09-21 MED ORDER — MORPHINE SULFATE (PF) 0.5 MG/ML IJ SOLN
INTRAMUSCULAR | Status: DC | PRN
  Administered 2011-09-21: 4 mg via EPIDURAL

## 2011-09-21 MED ORDER — OXYTOCIN 20 UNITS IN LACTATED RINGERS INFUSION - SIMPLE
1.0000 m[IU]/min | INTRAVENOUS | Status: DC
  Administered 2011-09-21: 2 m[IU]/min via INTRAVENOUS

## 2011-09-21 MED ORDER — OXYTOCIN 20 UNITS IN LACTATED RINGERS INFUSION - SIMPLE: 125.0000 mL/h | INTRAVENOUS | Status: AC

## 2011-09-21 MED ORDER — MEPERIDINE HCL 25 MG/ML IJ SOLN
INTRAMUSCULAR | Status: AC
  Administered 2011-09-21: 12.5 mg via INTRAVENOUS
  Filled 2011-09-21: qty 1

## 2011-09-21 MED ORDER — SODIUM CHLORIDE 0.9 % IJ SOLN: 3.0000 mL | INTRAMUSCULAR | Status: DC | PRN

## 2011-09-21 MED ORDER — SODIUM CHLORIDE 0.9 % IV SOLN: 1.0000 ug/kg/h | INTRAVENOUS | Status: DC | PRN

## 2011-09-21 MED ORDER — PRENATAL PLUS 27-1 MG PO TABS
1.0000 | ORAL_TABLET | Freq: Every day | ORAL | Status: DC
  Administered 2011-09-22 – 2011-09-23 (×2): 1 via ORAL
  Filled 2011-09-21 (×2): qty 1

## 2011-09-21 MED ORDER — CEFAZOLIN SODIUM-DEXTROSE 2-3 GM-% IV SOLR
2.0000 g | INTRAVENOUS | Status: AC
  Administered 2011-09-21: 2 g via INTRAVENOUS

## 2011-09-21 MED ORDER — MORPHINE SULFATE 0.5 MG/ML IJ SOLN
INTRAMUSCULAR | Status: AC
  Filled 2011-09-21: qty 10

## 2011-09-21 MED ORDER — OXYCODONE-ACETAMINOPHEN 5-325 MG PO TABS
1.0000 | ORAL_TABLET | ORAL | Status: DC | PRN
  Administered 2011-09-22 (×2): 1 via ORAL
  Administered 2011-09-22: 2 via ORAL
  Administered 2011-09-22 – 2011-09-23 (×5): 1 via ORAL
  Administered 2011-09-23: 2 via ORAL
  Administered 2011-09-24: 1 via ORAL
  Filled 2011-09-21: qty 2
  Filled 2011-09-21 (×4): qty 1
  Filled 2011-09-21: qty 2
  Filled 2011-09-21 (×5): qty 1

## 2011-09-21 MED ORDER — MEDROXYPROGESTERONE ACETATE 150 MG/ML IM SUSP: 150.0000 mg | INTRAMUSCULAR | Status: DC | PRN

## 2011-09-21 MED ORDER — DIBUCAINE 1 % RE OINT: 1.0000 "application " | TOPICAL_OINTMENT | RECTAL | Status: DC | PRN

## 2011-09-21 MED ORDER — IBUPROFEN 600 MG PO TABS: 600.0000 mg | ORAL_TABLET | Freq: Four times a day (QID) | ORAL | Status: DC | PRN

## 2011-09-21 MED ORDER — LACTATED RINGERS IV SOLN
INTRAVENOUS | Status: DC | PRN
  Administered 2011-09-21 (×3): via INTRAVENOUS

## 2011-09-21 MED ORDER — HYDROMORPHONE HCL 1 MG/ML IJ SOLN: 0.2500 mg | INTRAMUSCULAR | Status: DC | PRN

## 2011-09-21 MED ORDER — DIPHENHYDRAMINE HCL 50 MG/ML IJ SOLN: 12.5000 mg | INTRAMUSCULAR | Status: DC | PRN

## 2011-09-21 MED ORDER — SCOPOLAMINE 1 MG/3DAYS TD PT72: 1.0000 | MEDICATED_PATCH | Freq: Once | TRANSDERMAL | Status: DC

## 2011-09-21 MED ORDER — METOCLOPRAMIDE HCL 5 MG/ML IJ SOLN
10.0000 mg | Freq: Three times a day (TID) | INTRAMUSCULAR | Status: DC | PRN
  Administered 2011-09-21: 10 mg via INTRAVENOUS
  Filled 2011-09-21: qty 2

## 2011-09-21 SURGICAL SUPPLY — 33 items
BENZOIN TINCTURE PRP APPL 2/3 (GAUZE/BANDAGES/DRESSINGS) ×2 IMPLANT
CHLORAPREP W/TINT 26ML (MISCELLANEOUS) ×2 IMPLANT
CLOTH BEACON ORANGE TIMEOUT ST (SAFETY) ×2 IMPLANT
CONTAINER PREFILL 10% NBF 15ML (MISCELLANEOUS) IMPLANT
DRESSING TELFA 8X3 (GAUZE/BANDAGES/DRESSINGS) ×2 IMPLANT
ELECT REM PT RETURN 9FT ADLT (ELECTROSURGICAL) ×2
ELECTRODE REM PT RTRN 9FT ADLT (ELECTROSURGICAL) ×1 IMPLANT
EXTRACTOR VACUUM M CUP 4 TUBE (SUCTIONS) IMPLANT
GAUZE SPONGE 4X4 12PLY STRL LF (GAUZE/BANDAGES/DRESSINGS) ×2 IMPLANT
GLOVE BIO SURGEON STRL SZ 6.5 (GLOVE) ×4 IMPLANT
GOWN PREVENTION PLUS LG XLONG (DISPOSABLE) ×6 IMPLANT
KIT ABG SYR 3ML LUER SLIP (SYRINGE) IMPLANT
NEEDLE HYPO 25X5/8 SAFETYGLIDE (NEEDLE) ×2 IMPLANT
NS IRRIG 1000ML POUR BTL (IV SOLUTION) ×2 IMPLANT
PACK C SECTION WH (CUSTOM PROCEDURE TRAY) ×2 IMPLANT
PAD ABD 7.5X8 STRL (GAUZE/BANDAGES/DRESSINGS) ×2 IMPLANT
RTRCTR C-SECT PINK 25CM LRG (MISCELLANEOUS) ×2 IMPLANT
SLEEVE SCD COMPRESS KNEE MED (MISCELLANEOUS) ×2 IMPLANT
STRIP CLOSURE SKIN 1/2X4 (GAUZE/BANDAGES/DRESSINGS) ×2 IMPLANT
SUT MNCRL 0 VIOLET CTX 36 (SUTURE) ×2 IMPLANT
SUT MNCRL AB 3-0 PS2 27 (SUTURE) ×2 IMPLANT
SUT MONOCRYL 0 CTX 36 (SUTURE) ×2
SUT PLAIN 0 NONE (SUTURE) IMPLANT
SUT VIC AB 0 CT1 27 (SUTURE) ×2
SUT VIC AB 0 CT1 27XBRD ANBCTR (SUTURE) ×2 IMPLANT
SUT VIC AB 2-0 CT1 (SUTURE) IMPLANT
SUT VIC AB 2-0 CT1 27 (SUTURE) ×1
SUT VIC AB 2-0 CT1 TAPERPNT 27 (SUTURE) ×1 IMPLANT
SUT VIC AB 2-0 SH 27 (SUTURE)
SUT VIC AB 2-0 SH 27XBRD (SUTURE) IMPLANT
TOWEL OR 17X24 6PK STRL BLUE (TOWEL DISPOSABLE) ×4 IMPLANT
TRAY FOLEY CATH 14FR (SET/KITS/TRAYS/PACK) ×2 IMPLANT
WATER STERILE IRR 1000ML POUR (IV SOLUTION) ×2 IMPLANT

## 2011-09-21 NOTE — Progress Notes (Signed)
Discuss POC for csection

## 2011-09-21 NOTE — Progress Notes (Signed)
Patricia Reynolds is a 25 y.o. G2P0010 at [redacted]w[redacted]d by LMP admitted for induction of labor due to Hypertension.  Subjective:   Objective: BP 140/62  Pulse 71  Temp(Src) 97.9 F (36.6 C) (Axillary)  Resp 18  Ht 5\' 5"  (1.651 m)  Wt 113.399 kg (250 lb)  BMI 41.60 kg/m2 I/O last 3 completed shifts: In: -  Out: 350 [Emesis/NG output:350] Total I/O In: -  Out: 951 [Urine:950; Emesis/NG output:1]  FHT:  FHR: 140 bpm, variability: moderate,  accelerations:  Present,  decelerations:  Absent UC:   irregular, every 6 minutes SVE:   Dilation: 5 Effacement (%): 90 Station: -1 Exam by:: MD  Labs: Lab Results  Component Value Date   WBC 8.9 09/20/2011   HGB 10.5* 09/20/2011   HCT 31.5* 09/20/2011   MCV 77.4* 09/20/2011   PLT 259 09/20/2011    Assessment / Plan: Arrest in active phase of labor  Labor: See above Preeclampsia:  no change Fetal Wellbeing:  Category I Pain Control:  Epidural I/D:  n/a Anticipated MOD:  C/D  JACKSON-MOORE,[name] 09/21/2011, 6:14 AM

## 2011-09-21 NOTE — Anesthesia Postprocedure Evaluation (Signed)
  Anesthesia Post-op Note  Patient: Patricia Reynolds  Procedure(s) Performed:  CESAREAN SECTION  Patient Location: 129  Anesthesia Type: Epidural  Level of Consciousness: awake, alert  and oriented  Airway and Oxygen Therapy: Patient Spontanous Breathing  Post-op Pain: mild  Post-op Assessment: Post-op Vital signs reviewed, Patient's Cardiovascular Status Stable, No headache, No backache, No residual numbness and No residual motor weakness  Post-op Vital Signs: Reviewed and stable  Complications: No apparent anesthesia complications

## 2011-09-21 NOTE — Progress Notes (Signed)
To evaluate pt and redose epidural.

## 2011-09-21 NOTE — Progress Notes (Signed)
To or 

## 2011-09-21 NOTE — Progress Notes (Signed)
UR Chart review completed.  

## 2011-09-21 NOTE — Op Note (Signed)
Cesarean Section Procedure Note   [name]   09/19/2011 - 09/21/2011  Indications: Arrest of active labor   Pre-operative Diagnosis: Arrest of active labor  Post-operative Diagnosis: Same   Surgeon: Antionette Char A  Assistants: none  Anesthesia: epidural  Procedure Details:  The patient was seen in the Holding Room. The risks, benefits, complications, treatment options, and expected outcomes were discussed with the patient. The patient concurred with the proposed plan, giving informed consent. The patient was identified as Patricia Reynolds and the procedure verified as C-Section Delivery. A Time Out was held and the above information confirmed.  After induction of anesthesia, the patient was draped and prepped in the usual sterile manner. A transverse incision was made and carried down through the subcutaneous tissue to the fascia. The fascial incision was made and extended transversely. The fascia was separated from the underlying rectus tissue superiorly and inferiorly. The peritoneum was identified and entered. The peritoneal incision was extended longitudinally. A low transverse uterine incision was made. Delivered from cephalic presentation was a  living newborn female infant with Apgar scores of 8 at one minute and 9 at five minutes. A cord ph was not sent. The umbilical cord was clamped and cut cord. A sample was obtained for evaluation. The placenta was removed Intact and appeared normal.  The uterine incision was closed with running locked sutures of 1-0 Monocryl. A second imbricating layer of the same suture was placed.  Hemostasis was observed. The paracolic gutters were irrigated. The fascia was then reapproximated with running sutures of 1-0Vicryl. The subcuticular closure was performed using 3-0 Monocryl.  Instrument, sponge, and needle counts were correct prior the abdominal closure and were correct at the conclusion of the case.    Findings:   Estimated Blood  Loss: * No blood loss amount entered *   Total IV Fluids: per Anesthesiology  Urine Output: per Anesthesiology   Specimens:  Specimens    None       Complications: no complications  Disposition: PACU - hemodynamically stable.  Maternal Condition: stable   Baby condition / location:  nursery-stable    Signed: Surgeon(s): Roseanna Rainbow, MD

## 2011-09-21 NOTE — Anesthesia Postprocedure Evaluation (Signed)
Anesthesia Post Note  Patient: Patricia Reynolds  Procedure(s) Performed:  CESAREAN SECTION  Anesthesia type: Epidural  Patient location: PACU  Post pain: Pain level controlled  Post assessment: Post-op Vital signs reviewed  Last Vitals:  Filed Vitals:   09/21/11 0900  BP:   Pulse: 70  Temp:   Resp: 16    Post vital signs: Reviewed  Level of consciousness: awake  Complications: No apparent anesthesia complications

## 2011-09-21 NOTE — Transfer of Care (Signed)
Immediate Anesthesia Transfer of Care Note  Patient: Patricia Reynolds  Procedure(s) Performed:  CESAREAN SECTION  Patient Location: PACU  Anesthesia Type: Epidural  Level of Consciousness: awake, alert  and oriented  Airway & Oxygen Therapy: Patient Spontanous Breathing  Post-op Assessment: Report given to PACU RN and Post -op Vital signs reviewed and stable  Post vital signs: Reviewed and stable  Complications: No apparent anesthesia complications

## 2011-09-21 NOTE — Progress Notes (Signed)
Request to see pt. Pt c/o increase pain with epidural. Will come to evaluate patient.

## 2011-09-21 NOTE — Progress Notes (Signed)
Called Dr Arby Barrette with CBC results from PACU.  Ok to d/c epidural catheter in pacu.  Verified telephone order.

## 2011-09-21 NOTE — Progress Notes (Signed)
Evaluate for c/section

## 2011-09-21 NOTE — Progress Notes (Signed)
MD aware of pt status: uterine contraction pattern, FHT, current BP and meds given. New orders given.

## 2011-09-21 NOTE — Addendum Note (Signed)
Addendum  created 09/21/11 1725 by Karleen Dolphin   Modules edited:Notes Section

## 2011-09-22 ENCOUNTER — Encounter (HOSPITAL_COMMUNITY): Payer: Self-pay

## 2011-09-22 DIAGNOSIS — IMO0002 Reserved for concepts with insufficient information to code with codable children: Secondary | ICD-10-CM

## 2011-09-22 HISTORY — DX: Reserved for concepts with insufficient information to code with codable children: IMO0002

## 2011-09-22 NOTE — Progress Notes (Signed)
  Subjective: POD# 1 s/p Cesarean Delivery.  Indications: failure to progress  RH status/Rubella reviewed. Feeding: breast Patient reports tolerating PO.  Denies HA/SOB/C/P/N/V/dizziness.  Reports flatus or BM. Breast symptoms: None.  She reports vaginal bleeding as normal, without clots.  She is ambulating, urinating without difficulty.     Objective: Vital signs in last 24 hours: BP 131/79  Pulse 64  Temp(Src) 99 F (37.2 C) (Oral)  Resp 14  Ht 5\' 5"  (1.651 m)  Wt 113.399 kg (250 lb)  BMI 41.60 kg/m2  SpO2 98%  Breastfeeding? Unknown   Total I/O In: 560 [P.O.:560] Out: 450 [Urine:450]   Physical Exam:  General: alert CV: Regular rate and rhythm Resp: clear Abdomen: soft, nontender, normal bowel sounds Lochia: minimal Uterine Fundus: firm, below umbilicus, nontender Incision: clean, dry and intact Ext: extremities normal, atraumatic, no cyanosis or edema    Basename 09/22/11 0520 09/21/11 0922 09/20/11 2107  HGB 8.3* 10.0* --  HCT -- 30.2* 31.5*      Assessment/Plan: 25 y.o.  status post Cesarean section. POD# 1.   Doing well, stable.   Anemia            Advance diet as tolerated Start po pain meds D/C foley  HLIV  Ambulate IS Routine post-op care  JACKSON-MOORE,Talicia Sui A 09/22/2011, 11:11 AM

## 2011-09-23 NOTE — Progress Notes (Signed)
  Subjective: POD# 2 s/p Cesarean Delivery.  Indications: failure to progress  RH status/Rubella reviewed. Feeding: breast Patient reports tolerating PO.  Denies HA/SOB/C/P/N/V/dizziness.  Reports flatus or BM. Breast symptoms: None.  She reports vaginal bleeding as normal, without clots.  She is ambulating, urinating without difficulty.     Objective: Vital signs in last 24 hours: BP 137/76  Pulse 78  Temp(Src) 98 F (36.7 C) (Oral)  Resp 18  Ht 5\' 5"  (1.651 m)  Wt 113.399 kg (250 lb)  BMI 41.60 kg/m2  SpO2 97%  Breastfeeding? Unknown       Physical Exam:  General: alert CV: Regular rate and rhythm Resp: clear Abdomen: soft, nontender, normal bowel sounds Lochia: minimal Uterine Fundus: firm, below umbilicus, nontender Incision: clean, dry and intact Ext: extremities normal, atraumatic, no cyanosis or edema    Basename 09/22/11 0520 09/21/11 0922 09/20/11 2107  HGB 8.3* 10.0* --  HCT -- 30.2* 31.5*      Assessment/Plan: 25 y.o.  status post Cesarean section. POD# 2.   Doing well, stable.                Ambulate IS Routine post-op care  JACKSON-MOORE,Anand Tejada A 09/23/2011, 12:19 PM

## 2011-09-24 MED ORDER — FERROUS SULFATE 325 (65 FE) MG PO TABS: 325.0000 mg | ORAL_TABLET | Freq: Two times a day (BID) | ORAL | Status: DC

## 2011-09-24 MED ORDER — NORELGESTROMIN-ETH ESTRADIOL 150-35 MCG/24HR TD PTWK: 1.0000 | MEDICATED_PATCH | TRANSDERMAL | Status: DC

## 2011-09-24 MED ORDER — OXYCODONE-ACETAMINOPHEN 5-325 MG PO TABS: 1.0000 | ORAL_TABLET | Freq: Four times a day (QID) | ORAL | Status: AC | PRN

## 2011-09-24 MED ORDER — PRENATAL PLUS 27-1 MG PO TABS: 1.0000 | ORAL_TABLET | Freq: Every day | ORAL | Status: DC

## 2011-09-24 MED ORDER — IBUPROFEN 600 MG PO TABS: 600.0000 mg | ORAL_TABLET | Freq: Four times a day (QID) | ORAL | Status: AC

## 2011-09-24 NOTE — Discharge Summary (Signed)
Obstetric Discharge Summary Reason for Admission: induction of labor Prenatal Procedures: none Intrapartum Procedures: cesarean: low cervical, transverse Postpartum Procedures: none Complications-Operative and Postpartum: none Hemoglobin  Date Value Range Status  09/22/2011 8.3* 12.0-15.0 (g/dL) Final     DELTA CHECK NOTED     REPEATED TO VERIFY     HCT  Date Value Range Status  09/21/2011 30.2* 36.0-46.0 (%) Final    Discharge Diagnoses: Failed induction  Discharge Information: Date: 09/24/2011 Activity: pelvic rest Diet: routine Medications:  Prior to Admission medications   Medication Sig Start Date End Date Taking? Authorizing Provider  amLODipine (NORVASC) 5 MG tablet Take 5 mg by mouth daily.     Yes Historical Provider, MD  methyldopa (ALDOMET) 250 MG tablet Take 250 mg by mouth 3 (three) times daily.     Yes Historical Provider, MD  ferrous sulfate 325 (65 FE) MG tablet Take 1 tablet (325 mg total) by mouth 2 (two) times daily with Reynolds meal. 09/24/11 09/23/12  Roseanna Rainbow, MD  ibuprofen (ADVIL,MOTRIN) 600 MG tablet Take 1 tablet (600 mg total) by mouth every 6 (six) hours. 09/24/11 10/04/11  Roseanna Rainbow, MD  norelgestromin-ethinyl estradiol (ORTHO EVRA) 150-20 MCG/24HR transdermal patch Place 1 patch onto the skin once Reynolds week. 09/24/11 09/23/12  Roseanna Rainbow, MD  oxyCODONE-acetaminophen (PERCOCET) 5-325 MG per tablet Take 1-2 tablets by mouth every 6 (six) hours as needed (moderate - severe pain). 09/24/11 10/04/11  Roseanna Rainbow, MD  prenatal vitamin w/FE, FA (PRENATAL 1 + 1) 27-1 MG TABS Take 1 tablet by mouth daily. 09/24/11   Roseanna Rainbow, MD   Condition: stable Instructions: See above Discharge to: home Follow-up Information    Follow up with Antionette Char A, MD. Call in 2 weeks.   Contact information:   [address], [address] [place] [address] [phone]          Newborn Data: Live born female    Birth Weight: 8 lb 0.2 oz (3635 g) APGAR: 8, 9  Home with mother.  Patricia Reynolds,Patricia Reynolds 09/24/2011, 10:06 AM

## 2011-09-24 NOTE — Progress Notes (Signed)
Subjective: POD #3 s/p LTC/S  Indication:failure to progress Patient reports tolerating PO.  Denies HA/SOB/C/P/N/V/dizziness.  Reports flatus or BM. Breast symptoms:None.  She reports vaginal bleeding as normal, without clots.  She is ambulating, urinating without difficulty.     Objective: Vital signs in last 24 hours: BP 132/83  Pulse 83  Temp(Src) 98.3 F (36.8 C) (Oral)  Resp 20  Ht 5\' 5"  (1.651 m)  Wt 113.399 kg (250 lb)  BMI 41.60 kg/m2  SpO2 98%  Breastfeeding? Unknown  Physical Exam:  General: alert CV: Regular rate and rhythm Resp: clear Abdomen: soft, nontender, normal bowel sounds Lochia: none Uterine Fundus: firm, below umbilicus, nontender Incision: clean, dry and intact Ext: extremities normal, atraumatic, no cyanosis or edema  Basename 09/22/11 0520  HGB 8.3*  HCT --    Assessment/Plan: 25 y.o. status post Cesarean section POD# 3.  normal post-operative exam/candidate for contraceptive patch  Routine post-op care D/C home  JACKSON-MOORE,Burnette Sautter A 09/24/2011, 9:56 AM

## 2011-09-27 ENCOUNTER — Encounter (HOSPITAL_COMMUNITY): Payer: Self-pay | Admitting: Obstetrics & Gynecology

## 2011-11-01 ENCOUNTER — Encounter (HOSPITAL_COMMUNITY): Payer: Self-pay | Admitting: *Deleted

## 2011-11-01 ENCOUNTER — Inpatient Hospital Stay (HOSPITAL_COMMUNITY)
Admission: AD | Admit: 2011-11-01 | Discharge: 2011-11-02 | Disposition: A | Discharge status: Home / Self Care | Payer: Medicaid Other | Source: Ambulatory Visit | Attending: Obstetrics & Gynecology | Admitting: Obstetrics & Gynecology

## 2011-11-01 DIAGNOSIS — N920 Excessive and frequent menstruation with regular cycle: Secondary | ICD-10-CM

## 2011-11-01 NOTE — Progress Notes (Signed)
Pt reports c/s 09/21/2011, pt reports spotting "every day since delivery", started having "heavy bleeding like I have never experienced" x 4 days. Changing pad q 30 minutes x 3 days. Cramping.

## 2011-11-02 LAB — CBC
HCT: 30.6 % — ABNORMAL LOW (ref 36.0–46.0)
Hemoglobin: 9.8 g/dL — ABNORMAL LOW (ref 12.0–15.0)
RBC: 4.15 MIL/uL (ref 3.87–5.11)

## 2011-11-02 NOTE — ED Provider Notes (Signed)
History     Chief Complaint  Patient presents with  . Vaginal Bleeding   HPI This is a 25 y.o. who is 5.5 weeks post Cesarean Delivery. She states she had spotting afterward until 3 days ago when she developed heavier bleeding, soaking one pad every 30 minutes.  Denies cramping. Denies fever or other symptoms.   OB History    Grav Para Term Preterm Abortions TAB SAB Ect Mult Living   2 1 1  1  1   1       Past Medical History  Diagnosis Date  . Hypertension   . Maternal anemia complicating pregnancy, childbirth, or the puerperium 09/22/2011    Past Surgical History  Procedure Date  . Finger surgery   . Cesarean section 09/21/2011    Procedure: CESAREAN SECTION;  Surgeon: Roseanna Rainbow, MD;  Location: [facility];  Service: Gynecology;  Laterality: N/A;    Family History  Problem Relation Age of Onset  . Anesthesia problems Neg Hx   . Hypotension Neg Hx   . Malignant hyperthermia Neg Hx   . Pseudochol deficiency Neg Hx   . Depression Maternal Grandmother     History  Substance Use Topics  . Smoking status: Never Smoker   . Smokeless tobacco: Not on file  . Alcohol Use: No    Allergies:  Allergies  Allergen Reactions  . Darvocet (Propoxyphene N-Acetaminophen) Nausea And Vomiting    Prescriptions prior to admission  Medication Sig Dispense Refill  . amLODipine (NORVASC) 5 MG tablet Take 5 mg by mouth daily.        Marland Kitchen azithromycin (ZITHROMAX) 1 G powder Take 1 packet by mouth once.        . ferrous sulfate 325 (65 FE) MG tablet Take 1 tablet (325 mg total) by mouth 2 (two) times daily with a meal.  60 tablet  5  . fluconazole (DIFLUCAN) 200 MG tablet Take 200 mg by mouth daily. One tablet every other day       . methyldopa (ALDOMET) 250 MG tablet Take 250 mg by mouth 3 (three) times daily.        . penicillin v potassium (VEETID) 500 MG tablet Take 500 mg by mouth 3 (three) times daily.        . prenatal vitamin w/FE, FA (PRENATAL 1 + 1) 27-1 MG TABS Take 1  tablet by mouth daily.  60 each  3  . tinidazole (TINDAMAX) 500 MG tablet Take 500 mg by mouth daily with breakfast. Take four tablets as single dose         ROS As above  Physical Exam   Blood pressure 149/97, pulse 76, temperature 99 F (37.2 C), resp. rate 20, height 5\' 6"  (1.676 m), weight 232 lb (105.235 kg), last menstrual period 10/28/2011, SpO2 97.00%, not currently breastfeeding.  Physical Exam  Constitutional: She appears well-developed and well-nourished. No distress.  HENT:  Head: Normocephalic.  Cardiovascular: Normal rate.   Respiratory: Effort normal.  GI: Soft. She exhibits no distension and no mass. There is no tenderness. There is no rebound and no guarding.       Healing surgical scar  Genitourinary: Vaginal discharge (Moderate vaginal blood in vault, ) found.  No active hemorrhage noted  MAU Course  Procedures   Assessment and Plan  Vaginal bleeding after C/S  Probably menses  Will check CBC  [facility] 11/02/2011, 1:34 AM   Results for orders placed during the hospital encounter of 11/01/11 (from the past 24  hour(s))  CBC     Status: Abnormal   Collection Time   11/02/11 12:05 AM      Component Value Range   WBC 8.1  4.0 - 10.5 (K/uL)   RBC 4.15  3.87 - 5.11 (MIL/uL)   Hemoglobin 9.8 (*) 12.0 - 15.0 (g/dL)   HCT 46.9 (*) 62.9 - 46.0 (%)   MCV 73.7 (*) 78.0 - 100.0 (fL)   MCH 23.6 (*) 26.0 - 34.0 (pg)   MCHC 32.0  30.0 - 36.0 (g/dL)   RDW 52.8  41.3 - 24.4 (%)   Platelets 374  150 - 400 (K/uL)   Last Hgb was 10.0. Has not soaked even half a pad here Discussed probably first menses, since not breastfeeding Was planning to use patch, has not started yet  Will d/c home Advised to use Ibuprofen for cramps and to decrease bleeding Call MD if continues heavy bleeding after 2 more days  Keep 6 week appt as scheduled

## 2011-11-02 NOTE — Progress Notes (Signed)
Patricia Reynolds in to see pt.  assessment done and poc discussed with pt.

## 2011-11-02 NOTE — Progress Notes (Signed)
SSE per CNM.

## 2012-03-14 ENCOUNTER — Encounter (HOSPITAL_COMMUNITY): Payer: Self-pay | Admitting: *Deleted

## 2012-03-14 ENCOUNTER — Inpatient Hospital Stay (HOSPITAL_COMMUNITY)
Admission: EM | Admit: 2012-03-14 | Discharge: 2012-03-15 | DRG: 293 | Disposition: A | Discharge status: Home / Self Care | Payer: Medicaid Other | Attending: Internal Medicine | Admitting: Internal Medicine

## 2012-03-14 ENCOUNTER — Emergency Department (HOSPITAL_COMMUNITY): Payer: Medicaid Other

## 2012-03-14 DIAGNOSIS — O10919 Unspecified pre-existing hypertension complicating pregnancy, unspecified trimester: Secondary | ICD-10-CM

## 2012-03-14 DIAGNOSIS — Z79899 Other long term (current) drug therapy: Secondary | ICD-10-CM

## 2012-03-14 DIAGNOSIS — I379 Nonrheumatic pulmonary valve disorder, unspecified: Secondary | ICD-10-CM

## 2012-03-14 DIAGNOSIS — I5022 Chronic systolic (congestive) heart failure: Secondary | ICD-10-CM | POA: Diagnosis present

## 2012-03-14 DIAGNOSIS — R718 Other abnormality of red blood cells: Secondary | ICD-10-CM | POA: Diagnosis present

## 2012-03-14 DIAGNOSIS — I10 Essential (primary) hypertension: Secondary | ICD-10-CM

## 2012-03-14 DIAGNOSIS — I509 Heart failure, unspecified: Secondary | ICD-10-CM

## 2012-03-14 DIAGNOSIS — IMO0002 Reserved for concepts with insufficient information to code with codable children: Secondary | ICD-10-CM

## 2012-03-14 DIAGNOSIS — Z9119 Patient's noncompliance with other medical treatment and regimen: Secondary | ICD-10-CM

## 2012-03-14 DIAGNOSIS — E669 Obesity, unspecified: Secondary | ICD-10-CM

## 2012-03-14 DIAGNOSIS — I5021 Acute systolic (congestive) heart failure: Secondary | ICD-10-CM

## 2012-03-14 DIAGNOSIS — I5042 Chronic combined systolic (congestive) and diastolic (congestive) heart failure: Secondary | ICD-10-CM

## 2012-03-14 DIAGNOSIS — I517 Cardiomegaly: Secondary | ICD-10-CM

## 2012-03-14 DIAGNOSIS — Z888 Allergy status to other drugs, medicaments and biological substances status: Secondary | ICD-10-CM

## 2012-03-14 DIAGNOSIS — Z91199 Patient's noncompliance with other medical treatment and regimen due to unspecified reason: Secondary | ICD-10-CM

## 2012-03-14 DIAGNOSIS — Z6839 Body mass index (BMI) 39.0-39.9, adult: Secondary | ICD-10-CM

## 2012-03-14 HISTORY — DX: Chronic combined systolic (congestive) and diastolic (congestive) heart failure: I50.42

## 2012-03-14 LAB — MAGNESIUM: Magnesium: 1.7 mg/dL (ref 1.5–2.5)

## 2012-03-14 LAB — CBC
HCT: 37.5 % (ref 36.0–46.0)
MCV: 68.4 fL — ABNORMAL LOW (ref 78.0–100.0)
RBC: 5.48 MIL/uL — ABNORMAL HIGH (ref 3.87–5.11)
WBC: 8.1 10*3/uL (ref 4.0–10.5)

## 2012-03-14 LAB — PROTIME-INR
INR: 1.18 (ref 0.00–1.49)
Prothrombin Time: 15.2 seconds (ref 11.6–15.2)

## 2012-03-14 LAB — POCT I-STAT, CHEM 8
BUN: 13 mg/dL (ref 6–23)
Chloride: 106 mEq/L (ref 96–112)
Creatinine, Ser: 0.6 mg/dL (ref 0.50–1.10)
Glucose, Bld: 110 mg/dL — ABNORMAL HIGH (ref 70–99)
Potassium: 3.5 mEq/L (ref 3.5–5.1)

## 2012-03-14 LAB — TSH: TSH: 1.132 u[IU]/mL (ref 0.350–4.500)

## 2012-03-14 LAB — CARDIAC PANEL(CRET KIN+CKTOT+MB+TROPI)
Relative Index: 1.8 (ref 0.0–2.5)
Relative Index: 1.5 (ref 0.0–2.5)
Troponin I: <0.3 ng/mL (ref <0.30)

## 2012-03-14 MED ORDER — FUROSEMIDE 10 MG/ML IJ SOLN
20.0000 mg | Freq: Once | INTRAMUSCULAR | Status: AC
  Administered 2012-03-14: 20 mg via INTRAVENOUS
  Filled 2012-03-14: qty 2

## 2012-03-14 MED ORDER — ALBUTEROL SULFATE (5 MG/ML) 0.5% IN NEBU
2.5000 mg | INHALATION_SOLUTION | Freq: Once | RESPIRATORY_TRACT | Status: AC
  Administered 2012-03-14: 2.5 mg via RESPIRATORY_TRACT
  Filled 2012-03-14: qty 0.5

## 2012-03-14 MED ORDER — ASPIRIN EC 81 MG PO TBEC
81.0000 mg | DELAYED_RELEASE_TABLET | Freq: Every day | ORAL | Status: DC
  Administered 2012-03-14 – 2012-03-15 (×2): 81 mg via ORAL
  Filled 2012-03-14 (×2): qty 1

## 2012-03-14 MED ORDER — SODIUM CHLORIDE 0.9 % IJ SOLN: 3.0000 mL | INTRAMUSCULAR | Status: DC | PRN

## 2012-03-14 MED ORDER — ASPIRIN 81 MG PO CHEW
81.0000 mg | CHEWABLE_TABLET | Freq: Once | ORAL | Status: AC
  Administered 2012-03-14: 81 mg via ORAL

## 2012-03-14 MED ORDER — METOPROLOL TARTRATE 25 MG PO TABS
25.0000 mg | ORAL_TABLET | Freq: Two times a day (BID) | ORAL | Status: DC
  Administered 2012-03-14 (×2): 25 mg via ORAL
  Filled 2012-03-14 (×4): qty 1

## 2012-03-14 MED ORDER — NITROGLYCERIN IN D5W 200-5 MCG/ML-% IV SOLN
5.0000 ug/min | INTRAVENOUS | Status: DC
  Administered 2012-03-14: 5 ug/min via INTRAVENOUS
  Filled 2012-03-14: qty 250

## 2012-03-14 MED ORDER — ALPRAZOLAM 0.25 MG PO TABS: 0.2500 mg | ORAL_TABLET | Freq: Two times a day (BID) | ORAL | Status: DC | PRN

## 2012-03-14 MED ORDER — ACETAMINOPHEN 325 MG PO TABS
ORAL_TABLET | ORAL | Status: AC
  Filled 2012-03-14: qty 2

## 2012-03-14 MED ORDER — POTASSIUM CHLORIDE CRYS ER 20 MEQ PO TBCR
20.0000 meq | EXTENDED_RELEASE_TABLET | Freq: Two times a day (BID) | ORAL | Status: DC
  Administered 2012-03-14 – 2012-03-15 (×3): 20 meq via ORAL
  Filled 2012-03-14 (×4): qty 1

## 2012-03-14 MED ORDER — SODIUM CHLORIDE 0.9 % IV SOLN: 250.0000 mL | INTRAVENOUS | Status: DC | PRN

## 2012-03-14 MED ORDER — ONDANSETRON HCL 4 MG/2ML IJ SOLN: 4.0000 mg | Freq: Four times a day (QID) | INTRAMUSCULAR | Status: DC | PRN

## 2012-03-14 MED ORDER — ASPIRIN 81 MG PO CHEW
CHEWABLE_TABLET | ORAL | Status: AC
  Administered 2012-03-14: 81 mg via ORAL
  Filled 2012-03-14: qty 4

## 2012-03-14 MED ORDER — SODIUM CHLORIDE 0.9 % IJ SOLN
3.0000 mL | Freq: Two times a day (BID) | INTRAMUSCULAR | Status: DC
  Administered 2012-03-14 – 2012-03-15 (×2): 3 mL via INTRAVENOUS

## 2012-03-14 MED ORDER — FUROSEMIDE 10 MG/ML IJ SOLN
40.0000 mg | Freq: Two times a day (BID) | INTRAMUSCULAR | Status: DC
  Administered 2012-03-14 – 2012-03-15 (×3): 40 mg via INTRAVENOUS
  Filled 2012-03-14 (×5): qty 4

## 2012-03-14 MED ORDER — ACETAMINOPHEN 325 MG PO TABS
650.0000 mg | ORAL_TABLET | Freq: Once | ORAL | Status: AC
  Administered 2012-03-14: 650 mg via ORAL

## 2012-03-14 MED ORDER — LISINOPRIL 20 MG PO TABS
20.0000 mg | ORAL_TABLET | Freq: Every day | ORAL | Status: DC
  Administered 2012-03-14 – 2012-03-15 (×2): 20 mg via ORAL
  Filled 2012-03-14 (×2): qty 1

## 2012-03-14 MED ORDER — ACETAMINOPHEN 325 MG PO TABS
650.0000 mg | ORAL_TABLET | ORAL | Status: DC | PRN
  Administered 2012-03-14 (×2): 650 mg via ORAL
  Filled 2012-03-14 (×2): qty 2

## 2012-03-14 NOTE — ED Notes (Signed)
Admitting MD out of room, pending orders, pt sleeping/resting, family at [facility].

## 2012-03-14 NOTE — ED Provider Notes (Signed)
Medical screening examination/treatment/procedure(s) were conducted as a shared visit with non-physician practitioner(s) and myself.  I personally evaluated the patient during the encounter  Please see my separate respective documentation pertaining to this patient encounter   Vida Roller, MD 03/14/12 [phone]

## 2012-03-14 NOTE — ED Notes (Signed)
Pt with hx of htn, has been untreated since October when she was being treated for htn in pregnancy - has not taken meds since - is supposed to be on ACE and HCTZ.  She has had one day of CP and SOB and has had severe htn.  Sx were severe, positional, worse with laying down.  No associated fever, vomiting, swelling.  No family doctor, no cardiologist.  PE:  Has rales bilaterally, mild tachypnea, normal heart sounds, no peripheral edema, clear and moist OP and MM.  No rash, normal mentation and coordination and speech.  Assessment: pta pt was very  Hypertensive, now on lasix and nitro gtt to help with preload - suspect CHF given hypertension, cardiomegaly seen on xray and pulm edema.    Admit to hospital.  CC provided for hypertensive urgency, new onset CHF on nitro gtt.  CRITICAL CARE Performed by: Vida Roller   Total critical care time: 35  Critical care time was exclusive of separately billable procedures and treating other patients.  Critical care was necessary to treat or prevent imminent or life-threatening deterioration.  Critical care was time spent personally by me on the following activities: development of treatment plan with patient and/or surrogate as well as nursing, discussions with consultants, evaluation of patient's response to treatment, examination of patient, obtaining history from patient or surrogate, ordering and performing treatments and interventions, ordering and review of laboratory studies, ordering and review of radiographic studies, pulse oximetry and re-evaluation of patient's condition.   Filed Vitals:   03/14/12 0227  BP: 161/92  Temp: 98.6 F (37 C)  Resp: 23   Medical screening examination/treatment/procedure(s) were conducted as a shared visit with non-physician practitioner(s) and myself.  I personally evaluated the patient during the encounter   Vida Roller, MD 03/14/12 [phone]

## 2012-03-14 NOTE — ED Notes (Signed)
EDP into room, pt updated. 

## 2012-03-14 NOTE — Progress Notes (Signed)
  Echocardiogram 2D Echocardiogram has been performed.  Cathie Beams Deneen 03/14/2012, 3:59 PM

## 2012-03-14 NOTE — Progress Notes (Signed)
Utilization review complete 

## 2012-03-14 NOTE — Progress Notes (Signed)
   Patient seen and examined, database reviewed.  Patient seen earlier today by my colleague Dr. Lonia Blood.  Patient has history of PIH, came in with mild pulmonary edema , CHF and hypertensive emergency.  [place] cardiology consulted, and patient placed on antihypertensive medications regimen.  Clint Lipps Pager: [phone] 03/14/2012, 12:14 PM

## 2012-03-14 NOTE — ED Provider Notes (Signed)
History     CSN: [id number]  Arrival date & time 03/14/12  0216   First MD Initiated Contact with Patient 03/14/12 0248      Chief Complaint  Patient presents with  . Shortness of Breath  . Cough    (Consider location/radiation/quality/duration/timing/severity/associated sxs/prior treatment) HPI Comments: This evening while patient was an acute onset of coughing, shortness of breath, and chest pain that has been persistent.  She has a history of hypertension, but has not taken any antihypertensives in over a year due to the economy  Patient is a 26 y.o. female presenting with shortness of breath and cough. The history is provided by the patient.  Shortness of Breath  The current episode started today. The problem occurs frequently. The problem has been rapidly worsening. The problem is moderate. The symptoms are relieved by nothing. The symptoms are aggravated by activity. Associated symptoms include chest pain, cough and shortness of breath. Pertinent negatives include no fever, no rhinorrhea, no sore throat, no stridor and no wheezing.  Cough Associated symptoms include chest pain and shortness of breath. Pertinent negatives include no headaches, no rhinorrhea, no sore throat and no wheezing.    Past Medical History  Diagnosis Date  . Hypertension   . Maternal anemia complicating pregnancy, childbirth, or the puerperium 09/22/2011    Past Surgical History  Procedure Date  . Finger surgery   . Cesarean section 09/21/2011    Procedure: CESAREAN SECTION;  Surgeon: Roseanna Rainbow, MD;  Location: [facility];  Service: Gynecology;  Laterality: N/A;    Family History  Problem Relation Age of Onset  . Anesthesia problems Neg Hx   . Hypotension Neg Hx   . Malignant hyperthermia Neg Hx   . Pseudochol deficiency Neg Hx   . Depression Maternal Grandmother     History  Substance Use Topics  . Smoking status: Never Smoker   . Smokeless tobacco: Not on file  . Alcohol Use: No      OB History    Grav Para Term Preterm Abortions TAB SAB Ect Mult Living   2 1 1  1  1   1       Review of Systems  Constitutional: Negative for fever.  HENT: Negative for sore throat and rhinorrhea.   Respiratory: Positive for cough, chest tightness and shortness of breath. Negative for choking, wheezing and stridor.   Cardiovascular: Positive for chest pain.  Skin: Negative for rash.  Neurological: Negative for dizziness, weakness and headaches.    Allergies  Darvocet  Home Medications   Current Outpatient Rx  Name Route Sig Dispense Refill  . DIPHENHYDRAMINE HCL 25 MG PO TABS Oral Take 25 mg by mouth every 6 (six) hours as needed. For allergies      BP 161/92  Temp(Src) 98.6 F (37 C) (Oral)  Resp 23  SpO2 99%  LMP 03/06/2012  Physical Exam  Constitutional: She is oriented to person, place, and time. She appears well-developed and well-nourished.  HENT:  Head: Normocephalic.  Eyes: Pupils are equal, round, and reactive to light.  Neck: Normal range of motion.  Cardiovascular: Normal rate.   Pulmonary/Chest: Effort normal.  Musculoskeletal: She exhibits no edema and no tenderness.  Neurological: She is alert and oriented to person, place, and time.  Skin: Skin is warm.    ED Course  Procedures (including critical care time)   Labs Reviewed  CBC  D-DIMER, QUANTITATIVE   No results found.   No diagnosis found.  ED ECG REPORT  Date: 03/14/2012  EKG Time: 3:25 AM  Rate: 102  Rhythm: sinus tachycardia,  there are no previous tracings available for comparison  Axis: right  Intervals:none  ST&T Change: Borderline R Wave progression, borderline T wave abnormalities borderline prolonged QT interval   Narrative Interpretation: abnormal EKG             MDM  After review of patient's chest x-ray, which has a significant heart failure pattern had quite an enlarged heart.  She is being treated with nitroglycerin, aspirin, and Lasix  Patient's  d-dimer is mildly elevated, but this is most likely due to the vascular congestion and edema rather than a PE      Arman Filter, NP 03/14/12 [phone]

## 2012-03-14 NOTE — ED Notes (Addendum)
Pt sleeping/resting.

## 2012-03-14 NOTE — ED Notes (Signed)
Back from xray, persistant cough noted.

## 2012-03-14 NOTE — Consult Note (Signed)
Cardiology Consult Note   Patient ID: Patricia Reynolds MRN: [id number], DOB/AGE: 1986-04-24   Admit date: 03/14/2012 Date of Consult: 03/14/2012  Primary Physician: Sheila Oats, MD, MD Primary Cardiologist: New to cardiology  Pt. Profile: Patricia Reynolds is a 26yo AA female with PMHx significant for hypertension, gestational hypertension, anemia and obesity who was admitted to [facility] hospital on 03/13/12 with flash pulmonary edema in the setting of hypertensive emergency.   Reason for consult: evaluation/management of acute CHF  Problem List: Past Medical History  Diagnosis Date  . Hypertension   . Maternal anemia complicating pregnancy, childbirth, or the puerperium 09/22/2011    Past Surgical History  Procedure Date  . Finger surgery   . Cesarean section 09/21/2011    Procedure: CESAREAN SECTION;  Surgeon: Roseanna Rainbow, MD;  Location: [facility];  Service: Gynecology;  Laterality: N/A;     Allergies:  Allergies  Allergen Reactions  . Darvocet (Propoxacet-N) Nausea And Vomiting    HPI:   She reports last night around 11:00 PM, she experienced increased shortness of breath, with associated SSCP described as pressure without radiation, rated at a 8/10. She also reports associated cough. She took a benadryl without relief. No aggravating factors. This worsened throughout the night and EMS was called.  She denies lightheadedness, dizziness, palpitations, syncope, orthopnea, PND, LE edema, fevers, chills, n/v/d, sick contacts or extended travel. No thyroid issues. Never diagnosed as diabetic or prediabetic. No urinary, BM changes. She reports taking Norvasc, Methyldopa for gestational hypertension. She does not take outpatient antihypertensives or additional medications.   BP per EMS was found to be 220/108. She was subsequently transported to [facility] ED.   In the ED, EKG revealed sinus tachycardia with diffuse TW flattening. pBNP was elevated at 973.6. CXR revealed cardiomegaly,  pulmonary vascular congestion and mild edema with a possible superimposed infiltrate in the RML. BUN/Cr WNL. No leukocytosis. H/H stable, but with evidence of microcytosis. D-dimer mildly elevated. Pregnancy test negative. No UDS ordered. No TSH. SBP 160 in the ED with reduction to 130s. Mild tachycardia. Afebrile. Normal respirations.   She was admitted by the medicine service, started on ACEi/BB and Lasix. 2D echo was ordered and is pending.   Home Medications: Prior to Admission medications   Medication Sig Start Date End Date Taking? Authorizing Provider  diphenhydrAMINE (BENADRYL) 25 MG tablet Take 25 mg by mouth every 6 (six) hours as needed. For allergies   Yes Historical Provider, MD    Inpatient Medications:     . acetaminophen  650 mg Oral Once  . albuterol  2.5 mg Nebulization Once  . aspirin  81 mg Oral Once  . aspirin EC  81 mg Oral Daily  . furosemide  20 mg Intravenous Once  . furosemide  40 mg Intravenous BID  . lisinopril  20 mg Oral Daily  . metoprolol tartrate  25 mg Oral BID  . potassium chloride  20 mEq Oral BID  . sodium chloride  3 mL Intravenous Q12H   Prescriptions prior to admission  Medication Sig Dispense Refill  . diphenhydrAMINE (BENADRYL) 25 MG tablet Take 25 mg by mouth every 6 (six) hours as needed. For allergies        Family History  Problem Relation Age of Onset  . Anesthesia problems Neg Hx   . Hypotension Neg Hx   . Malignant hyperthermia Neg Hx   . Pseudochol deficiency Neg Hx   . Depression Maternal Grandmother      History   Social History  .  Marital Status: Single    Spouse Name: N/A    Number of Children: N/A  . Years of Education: N/A   Occupational History  . Not on file.   Social History Main Topics  . Smoking status: Never Smoker   . Smokeless tobacco: Not on file  . Alcohol Use: No  . Drug Use: No  . Sexually Active: Yes   Other Topics Concern  . Not on file   Social History Narrative  . No narrative on file       Review of Systems: General:  negative for chills, fever, night sweats or weight changes.  Cardiovascular: positive for chest pain, shortness of breath, negative for dyspnea on exertion, edema, orthopnea, palpitations, paroxysmal nocturnal dyspnea Dermatological: negative for rash Respiratory: positive for cough, negative for wheezing Urologic:  negative for hematuria Abdominal:  negative for nausea, vomiting, diarrhea, bright red blood per rectum, melena, or hematemesis Neurologic:  negative for visual changes, syncope, or dizziness All other systems reviewed and are otherwise negative except as noted above.  Physical Exam: Blood pressure 127/81, pulse 96, temperature 98.2 F (36.8 C), temperature source Oral, resp. rate 20, height 5\' 6"  (1.676 m), weight 109.6 kg (241 lb 10 oz), last menstrual period 03/06/2012, SpO2 99.00%.    General:  Well developed, obese, in no acute distress. Head: Normocephalic, atraumatic, sclera non-icteric, no xanthomas, nares are without discharge.  Neck: Acanthosis nigricans appreciated. Negative for carotid bruits. JVD not elevated. Lungs: Clear bilaterally to auscultation without wheezes, rales, or rhonchi. Breathing is unlabored. Heart: RRR with S1 S2. No murmurs, rubs, or gallops appreciated. Abdomen: Soft, non-tender, non-distended with normoactive bowel sounds. No hepatomegaly. No rebound/guarding. No obvious abdominal masses. Msk:  Strength and tone appears normal for age. Extremities: No clubbing, cyanosis or edema.  Distal pedal pulses are 2+ and equal bilaterally. Neuro: Alert and oriented X 3. Moves all extremities spontaneously. Psych:  Responds to questions appropriately with a normal affect.  Labs: Recent Labs  Basename 03/14/12 0318 03/14/12 0256   WBC -- 8.1   HGB 14.6 12.5   HCT 43.0 37.5   MCV -- 68.4*   PLT -- 407*   Recent Labs  Basename 03/14/12 0256   DDIMER 0.62*    Lab 03/14/12 0318  NA 141  K 3.5  CL 106  CO2 --   BUN 13  CREATININE 0.60  CALCIUM --  PROT --  BILITOT --  ALKPHOS --  ALT --  AST --  AMYLASE --  LIPASE --  GLUCOSE 110*   Radiology/Studies: Dg Chest 2 View  03/14/2012  *RADIOLOGY REPORT*  Clinical Data: Cough and shortness of breath  CHEST - 2 VIEW  Comparison: None.  Findings: Slightly shallow inspiration.  Cardiac enlargement with prominent pulmonary vascularity and hazy infiltrates suggesting edema.  Superimposed consolidation or pneumonia in the right middle lung is not excluded.  No blunting of costophrenic angles.  No pneumothorax.  IMPRESSION: Cardiac enlargement with pulmonary vascular congestion and mild edema.  Possible superimposed infiltration in the right middle lung.  Original Report Authenticated By: Marlon Pel, M.D.    EKG: sinus tachycardia, 103 bpm, RAD, diffuse TW flattening  ASSESSMENT AND PLAN:  Patricia Reynolds is a 26yo AA female with PMHx significant for hypertension, gestational hypertension, anemia and obesity who was admitted to [facility] hospital on 03/13/12 with flash pulmonary edema in the setting of hypertensive urgency.   1. Acute CHF- unspecified, but likely in the setting of markedly hypertensive emergency. The patient's shortness  of breath is much improved today. She denies current chest pain. She has diuresed well since yesterday (I/O net - 1100 cc). The patient's EKG on admission did show some TW flattening, however, there is no baseline tracing to reference, and may have likely represented demand ischemia in the setting her markedly elevated BP. No significant family history of cardiac issues. No history of type 2 DM, tobacco abuse. She does have two cardiac risk factors in obesity and hypertension.  - Will review 2D echo  - Continue Lasix   - Continue ACEi/BB as BP is well controlled  2. Hypertensive emergency- BP much better controlled today. She has a family history of hypertension, as well as a personal history of gestational hypertension. She  does not take antihypertensives in the outpatient setting. She has not followed up with a PCP for this. This may be secondary to obesity and lack of antihypertensive therapy. TSH pending.   - Will certainly need an antihypertensive regimen with consistent PCP follow-up   3. Microcytosis- patient states she had taken iron during her pregnancy. H/H stable. No active bleeding. This will need to be followed-up outpatient.  Signed, R. Hurman Horn, PA-C 03/14/2012, 9:57 AM  Agree with above assessment.  Patient stopped her BP meds after her pregnancy.  She has had no followup medically since parturition in December.  She was having no antecedent symptoms until sudden onset of dyspnea last night. BP was markedly elevated on admission.  On exam now her BP is improving and she denies dyspnea. Exam reveals clear lungs now and there is no gallop rhythm.  No edema. Plan will be to check echo for LV function, continue ACEi, BB, and lasix.  She may need help in finding a primary care physician for ongoing primary care re her BP

## 2012-03-14 NOTE — ED Notes (Signed)
Lab finished at [facility], pt to xray.

## 2012-03-14 NOTE — ED Notes (Addendum)
Here by EMS, here from home with husband and son (child), here for sob, EMS reports violent coughing spell, h/o HTN, BP noted to be high, IV in place, NSL 20g L hand, "has no insurance and therfore does not take meds for htn. R 230/140. L 212/130. HR 120. Arrives feeling better, NAD, calm, interactive, skin W&D, resps e/u, speaking in clear complete sentences, intermitant frequent coughing remains/noted.

## 2012-03-14 NOTE — ED Notes (Signed)
EDNP at [facility] 

## 2012-03-14 NOTE — H&P (Signed)
Patricia Reynolds is an 26 y.o. female.   Chief Complaint: Shortness of breath HPI: This is a 26 year old female with long-standing hypertension who presented with sudden onset of shortness of breath this afternoon. She describes her symptoms as sudden. She has no private previous PND orthopnea or any Leg swelling. She had pregnancy-induced hypertension and was taking medication to see she delivered about 6 months ago she has not been on any antihypertensives. By EMS high blood pressure was about 220 over 108. Patient reported cough also with the symptoms. She still did not have any leg swellings denied any chest pain nausea vomiting diarrhea. Heart chest x-ray in the ER showed evidence of CHF. Possibly superimposed infiltrate but no evidence of pneumonia really. She denied any prior history of cardiac disease but she's does have some cardiomegaly on her chest x-ray.  Past Medical History  Diagnosis Date  . Hypertension   . Maternal anemia complicating pregnancy, childbirth, or the puerperium 09/22/2011    Past Surgical History  Procedure Date  . Finger surgery   . Cesarean section 09/21/2011    Procedure: CESAREAN SECTION;  Surgeon: Roseanna Rainbow, MD;  Location: [facility];  Service: Gynecology;  Laterality: N/A;    Family History  Problem Relation Age of Onset  . Anesthesia problems Neg Hx   . Hypotension Neg Hx   . Malignant hyperthermia Neg Hx   . Pseudochol deficiency Neg Hx   . Depression Maternal Grandmother    Social History:  reports that she has never smoked. She does not have any smokeless tobacco history on file. She reports that she does not drink alcohol or use illicit drugs.  Allergies:  Allergies  Allergen Reactions  . Darvocet (Propoxacet-N) Nausea And Vomiting     (Not in a hospital admission)  Results for orders placed during the hospital encounter of 03/14/12 (from the past 48 hour(s))  CBC     Status: Abnormal   Collection Time   03/14/12  2:56 AM   Component Value Range Comment   WBC 8.1  4.0 - 10.5 (K/uL)    RBC 5.48 (*) 3.87 - 5.11 (MIL/uL)    Hemoglobin 12.5  12.0 - 15.0 (g/dL)    HCT 09.8  11.9 - 14.7 (%)    MCV 68.4 (*) 78.0 - 100.0 (fL)    MCH 22.8 (*) 26.0 - 34.0 (pg)    MCHC 33.3  30.0 - 36.0 (g/dL)    RDW 82.9 (*) 56.2 - 15.5 (%)    Platelets 407 (*) 150 - 400 (K/uL)   D-DIMER, QUANTITATIVE     Status: Abnormal   Collection Time   03/14/12  2:56 AM      Component Value Range Comment   D-Dimer, Quant 0.62 (*) 0.00 - 0.48 (ug/mL-FEU)   POCT I-STAT, CHEM 8     Status: Abnormal   Collection Time   03/14/12  3:18 AM      Component Value Range Comment   Sodium 141  135 - 145 (mEq/L)    Potassium 3.5  3.5 - 5.1 (mEq/L)    Chloride 106  96 - 112 (mEq/L)    BUN 13  6 - 23 (mg/dL)    Creatinine, Ser 1.30  0.50 - 1.10 (mg/dL)    Glucose, Bld 865 (*) 70 - 99 (mg/dL)    Calcium, Ion 7.84  1.12 - 1.32 (mmol/L)    TCO2 24  0 - 100 (mmol/L)    Hemoglobin 14.6  12.0 - 15.0 (g/dL)  HCT 43.0  36.0 - 46.0 (%)   PRO B NATRIURETIC PEPTIDE     Status: Abnormal   Collection Time   03/14/12  3:46 AM      Component Value Range Comment   Pro B Natriuretic peptide (BNP) 973.6 (*) 0 - 125 (pg/mL)   POCT PREGNANCY, URINE     Status: Normal   Collection Time   03/14/12  4:55 AM      Component Value Range Comment   Preg Test, Ur NEGATIVE  NEGATIVE     Dg Chest 2 View  03/14/2012  *RADIOLOGY REPORT*  Clinical Data: Cough and shortness of breath  CHEST - 2 VIEW  Comparison: None.  Findings: Slightly shallow inspiration.  Cardiac enlargement with prominent pulmonary vascularity and hazy infiltrates suggesting edema.  Superimposed consolidation or pneumonia in the right middle lung is not excluded.  No blunting of costophrenic angles.  No pneumothorax.  IMPRESSION: Cardiac enlargement with pulmonary vascular congestion and mild edema.  Possible superimposed infiltration in the right middle lung.  Original Report Authenticated By: Marlon Pel, M.D.    Review of Systems  Respiratory: Positive for cough and shortness of breath. Negative for hemoptysis, sputum production and wheezing.   Cardiovascular: Negative for chest pain, palpitations, orthopnea, claudication, leg swelling and PND.  All other systems reviewed and are negative.    Blood pressure 138/67, pulse 48, temperature 98.6 F (37 C), temperature source Oral, resp. rate 25, last menstrual period 03/06/2012, SpO2 95.00%. Physical Exam  Constitutional: She is oriented to person, place, and time. She appears well-developed and well-nourished.  HENT:  Head: Normocephalic and atraumatic.  Right Ear: External ear normal.  Left Ear: External ear normal.  Nose: Nose normal.  Mouth/Throat: Oropharynx is clear and moist.  Eyes: Conjunctivae and EOM are normal. Pupils are equal, round, and reactive to light.  Neck: Normal range of motion. Neck supple.  Cardiovascular: Normal rate, regular rhythm, normal heart sounds and intact distal pulses.   Respiratory: Effort normal and breath sounds normal. No respiratory distress. She has no wheezes. She has no rales. She exhibits no tenderness.  GI: Soft. Bowel sounds are normal.  Musculoskeletal: Normal range of motion.  Neurological: She is alert and oriented to person, place, and time. She has normal reflexes.  Skin: Skin is warm and dry.  Psychiatric: She has a normal mood and affect. Her behavior is normal. Judgment and thought content normal.     Assessment/Plan Assessment this is a 26 year old female presented with what appeared to be acute CHF probably flash pulmonary edema from uncontrolled HTN. Postpartum heart failure is also a consideration here.  Plan #1 acute CHF:  More than likely this is due to uncontrolled hypertension with hypertensive heart disease and possibly CHF exacerbation from flash pulmonary edema. We will admit the patient to telemetry for observation we'll diurese aggressively start on some ACE  inhibitors beta blockers as well as diuretics. We'll get 2-D echo to evaluate her left ventricular ejection fraction. We will consider possible cardiology consult. #2 hypertensive urgency: We will add nitroglycerin to patient's medication. And as well as continue with diuresis as well as beta blockers ACE inhibitors for blood pressure control as well. #3 Obesity: Patient will need dietary counseling. #4 cardiomegaly: Most likely from long-standing hypertension. We will again assess her left ventricular ejection fraction and then see what what else to do for long-term treatment.  Layken Beg,LAWAL 03/14/2012, 5:28 AM

## 2012-03-15 DIAGNOSIS — I10 Essential (primary) hypertension: Secondary | ICD-10-CM

## 2012-03-15 DIAGNOSIS — I517 Cardiomegaly: Secondary | ICD-10-CM

## 2012-03-15 DIAGNOSIS — I5021 Acute systolic (congestive) heart failure: Principal | ICD-10-CM

## 2012-03-15 DIAGNOSIS — I509 Heart failure, unspecified: Secondary | ICD-10-CM

## 2012-03-15 LAB — RAPID URINE DRUG SCREEN, HOSP PERFORMED
Benzodiazepines: NOT DETECTED
Cocaine: NOT DETECTED
Opiates: NOT DETECTED

## 2012-03-15 LAB — BASIC METABOLIC PANEL
GFR calc Af Amer: >90 mL/min (ref >90)
GFR calc non Af Amer: >90 mL/min (ref >90)
Glucose, Bld: 102 mg/dL — ABNORMAL HIGH (ref 70–99)
Potassium: 3.8 mEq/L (ref 3.5–5.1)
Sodium: 136 mEq/L (ref 135–145)

## 2012-03-15 LAB — CARDIAC PANEL(CRET KIN+CKTOT+MB+TROPI)
CK, MB: 2.2 ng/mL (ref 0.3–4.0)
Relative Index: 1.1 (ref 0.0–2.5)
Troponin I: <0.3 ng/mL (ref <0.30)

## 2012-03-15 MED ORDER — POTASSIUM CHLORIDE CRYS ER 20 MEQ PO TBCR: 20.0000 meq | EXTENDED_RELEASE_TABLET | Freq: Two times a day (BID) | ORAL | Status: DC

## 2012-03-15 MED ORDER — CARVEDILOL 12.5 MG PO TABS
12.5000 mg | ORAL_TABLET | Freq: Two times a day (BID) | ORAL | Status: DC
  Administered 2012-03-15: 12.5 mg via ORAL
  Filled 2012-03-15 (×3): qty 1

## 2012-03-15 MED ORDER — FUROSEMIDE 40 MG PO TABS: 40.0000 mg | ORAL_TABLET | Freq: Every day | ORAL | Status: DC

## 2012-03-15 MED ORDER — CARVEDILOL 12.5 MG PO TABS: 12.5000 mg | ORAL_TABLET | Freq: Two times a day (BID) | ORAL | Status: DC

## 2012-03-15 MED ORDER — LISINOPRIL 20 MG PO TABS: 20.0000 mg | ORAL_TABLET | Freq: Every day | ORAL | Status: DC

## 2012-03-15 MED ORDER — FUROSEMIDE 40 MG PO TABS
40.0000 mg | ORAL_TABLET | Freq: Every day | ORAL | Status: DC
  Filled 2012-03-15: qty 1

## 2012-03-15 NOTE — Progress Notes (Signed)
TELEMETRY: Reviewed telemetry pt in NSR: Filed Vitals:   03/14/12 0800 03/14/12 1200 03/14/12 2117 03/15/12 0606  BP: 127/81 119/84 144/71 117/75  Pulse: 96 77 83 78  Temp: 98.2 F (36.8 C) 98.2 F (36.8 C) 98.7 F (37.1 C) 97.9 F (36.6 C)  TempSrc: Oral Oral Oral Oral  Resp: 20 18 18 18   Height:      Weight:      SpO2: 99% 98% 96% 98%    Intake/Output Summary (Last 24 hours) at 03/15/12 0820 Last data filed at 03/14/12 1100  Gross per 24 hour  Intake      0 ml  Output    700 ml  Net   -700 ml    SUBJECTIVE Feels very well. No SOB or chest pain. No edema.  LABS: Basic Metabolic Panel:  Basename 03/15/12 0620 03/14/12 0918 03/14/12 0318  NA 136 -- 141  K 3.8 -- 3.5  CL 102 -- 106  CO2 24 -- --  GLUCOSE 102* -- 110*  BUN 18 -- 13  CREATININE 0.72 -- 0.60  CALCIUM 9.4 -- --  MG -- 1.7 --  PHOS -- -- --   Liver Function Tests: No results found for this basename: AST:2,ALT:2,ALKPHOS:2,BILITOT:2,PROT:2,ALBUMIN:2 in the last 72 hours No results found for this basename: LIPASE:2,AMYLASE:2 in the last 72 hours CBC:  Basename 03/14/12 0318 03/14/12 0256  WBC -- 8.1  NEUTROABS -- --  HGB 14.6 12.5  HCT 43.0 37.5  MCV -- 68.4*  PLT -- 407*   Cardiac Enzymes:  Basename 03/14/12 2335 03/14/12 1616 03/14/12 0918  CKTOTAL 195* 172 159  CKMB 2.2 2.5 2.8  CKMBINDEX -- -- --  TROPONINI <0.30 <0.30 <0.30   BNP: No components found with this basename: POCBNP:3 D-Dimer:  Alvira Philips 03/14/12 0256  DDIMER 0.62*   Hemoglobin A1C: No results found for this basename: HGBA1C in the last 72 hours Fasting Lipid Panel: No results found for this basename: CHOL,HDL,LDLCALC,TRIG,CHOLHDL,LDLDIRECT in the last 72 hours Thyroid Function Tests:  Basename 03/14/12 0918  TSH 1.132  T4TOTAL --  T3FREE --  THYROIDAB --   Anemia Panel: No results found for this basename: VITAMINB12,FOLATE,FERRITIN,TIBC,IRON,RETICCTPCT in the last 72 hours  Radiology/Studies:  Dg Chest 2  View  03/14/2012  *RADIOLOGY REPORT*  Clinical Data: Cough and shortness of breath  CHEST - 2 VIEW  Comparison: None.  Findings: Slightly shallow inspiration.  Cardiac enlargement with prominent pulmonary vascularity and hazy infiltrates suggesting edema.  Superimposed consolidation or pneumonia in the right middle lung is not excluded.  No blunting of costophrenic angles.  No pneumothorax.  IMPRESSION: Cardiac enlargement with pulmonary vascular congestion and mild edema.  Possible superimposed infiltration in the right middle lung.  Original Report Authenticated By: Marlon Pel, M.D.   ECG shows NSR with LVH by voltage and nonspecific TWA.  ECHO: [facility]* *[facility]* [address] [place], [place] [address] [phone]  ------------------------------------------------------------ Transthoracic Echocardiography  Patient: Patricia Reynolds, Patricia Reynolds MR #: [id number] Study Date: 03/14/2012 Gender: F Age: 26 Height: 167.6cm Weight: 109.6kg BSA: 2.69m^2 Pt. Status: Room: 3735  PERFORMING [place], [facility] SONOGRAPHER Cathie Beams, RCS ATTENDING [facility], Mutaz Rico Junker, Mohammad cc:  ------------------------------------------------------------ LV EF: 20% - 25%  ------------------------------------------------------------ Indications: CHF - 428.0.  ------------------------------------------------------------ History: PMH: Cardiomegaly. Obesity. Risk factors: Hypertension.  ------------------------------------------------------------ Study Conclusions  - Left ventricle: The cavity size was normal. Wall thickness was increased in a pattern of mild LVH. Systolic function was severely reduced. The estimated ejection fraction  was in the range of 20% to 25%. Diffuse hypokinesis. Features are consistent with a pseudonormal left ventricular filling pattern, with concomitant abnormal relaxation and increased filling pressure (grade 2  diastolic dysfunction). Doppler parameters are consistent with high ventricular filling pressure. - Left atrium: The atrium was mildly dilated.   PHYSICAL EXAM General: Well developed, obese, in no acute distress. Head: Normocephalic, atraumatic, sclera non-icteric, no xanthomas, nares are without discharge. Neck: Negative for carotid bruits. JVD not elevated. Lungs: Clear bilaterally to auscultation without wheezes, rales, or rhonchi. Breathing is unlabored. Heart: RRR S1 S2 without murmurs, rubs, or gallops.  Abdomen: Soft, non-tender, non-distended with normoactive bowel sounds. No hepatomegaly. No rebound/guarding. No obvious abdominal masses. Msk:  Strength and tone appears normal for age. Extremities: No clubbing, cyanosis or edema.  Distal pedal pulses are 2+ and equal bilaterally. Neuro: Alert and oriented X 3. Moves all extremities spontaneously. Psych:  Responds to questions appropriately with a normal affect.  ASSESSMENT AND PLAN: 1. Acute systolic CHF. Severe LV dysfunction by Echo with EF 20-25%. I suspect this is related to longstanding uncontrolled HTN. Cardiac enzymes negative. TSH is normal. Diuresing well.  2. Hypertensive emergency with acute pulmonary edema. Blood pressure now well controlled.   Plan: Will switch Lasix to po. Change metoprolol to carvedilol 12.5 mg bid. Continue ACEi. Will need to follow closely and maximize medical therapy over the next few weeks. I stressed the importance of medical compliance and sodium restriction. Will need cardiology follow up as outpatient. Would recheck Echo in a few months after meds optimized.  Active Problems:  Acute systolic congestive heart failure  Cardiomegaly  HTN (hypertension)  Obesity    Leonides Schanz Arilla Hice [place] MD,FACC 03/15/2012 8:20 AM

## 2012-03-15 NOTE — Discharge Summary (Signed)
Admit date: 03/14/2012 Discharge date: 03/15/2012  Primary Care Physician:  Sheila Oats, MD, MD   Discharge Diagnoses:   No resolved problems to display.  Active Hospital Problems  Diagnoses Date Noted   . Acute systolic congestive heart failure 03/14/2012   . Cardiomegaly 03/14/2012   . HTN (hypertension) 03/14/2012   . Obesity 03/14/2012     Resolved Hospital Problems  Diagnoses Date Noted Date Resolved     DISCHARGE MEDICATION: Medication List  As of 03/15/2012 11:42 AM   TAKE these medications         carvedilol 12.5 MG tablet   Commonly known as: COREG   Take 1 tablet (12.5 mg total) by mouth 2 (two) times daily with a meal.      diphenhydrAMINE 25 MG tablet   Commonly known as: BENADRYL   Take 25 mg by mouth every 6 (six) hours as needed. For allergies      furosemide 40 MG tablet   Commonly known as: LASIX   Take 1 tablet (40 mg total) by mouth daily.      lisinopril 20 MG tablet   Commonly known as: PRINIVIL,ZESTRIL   Take 1 tablet (20 mg total) by mouth daily.      potassium chloride SA 20 MEQ tablet   Commonly known as: K-DUR,KLOR-CON   Take 1 tablet (20 mEq total) by mouth 2 (two) times daily.            Consults: Treatment Team:  Rounding Lbcardiology, MD   SIGNIFICANT DIAGNOSTIC STUDIES:  Dg Chest 2 View  03/14/2012  *RADIOLOGY REPORT*  Clinical Data: Cough and shortness of breath  CHEST - 2 VIEW  Comparison: None.  Findings: Slightly shallow inspiration.  Cardiac enlargement with prominent pulmonary vascularity and hazy infiltrates suggesting edema.  Superimposed consolidation or pneumonia in the right middle lung is not excluded.  No blunting of costophrenic angles.  No pneumothorax.  IMPRESSION: Cardiac enlargement with pulmonary vascular congestion and mild edema.  Possible superimposed infiltration in the right middle lung.  Original Report Authenticated By: Marlon Pel, M.D.   ECHO: - Left ventricle: The cavity size was normal.  Wall thickness was increased in a pattern of mild LVH. Systolic function was severely reduced. The estimated ejection fraction was in the range of 20% to 25%. Diffuse hypokinesis. Features are consistent with a pseudonormal left ventricular filling pattern, with concomitant abnormal relaxation and increased filling pressure (grade 2 diastolic dysfunction). Doppler parameters are consistent with high ventricular filling pressure.     No results found for this or any previous visit (from the past 240 hour(s)).  BRIEF ADMITTING H & P: 26 year old female with long-standing hypertension who presented with sudden onset of shortness of breath this afternoon. She describes her symptoms as sudden. She has no private previous PND orthopnea or any Leg swelling. She had pregnancy-induced hypertension and was taking medication to see she delivered about 6 months ago she has not been on any antihypertensives. By EMS high blood pressure was about 220 over 108. Patient reported cough also with the symptoms. She still did not have any leg swellings denied any chest pain nausea vomiting diarrhea. Heart chest x-ray in the ER showed evidence of CHF. Possibly superimposed infiltrate but no evidence of pneumonia really. She denied any prior history of cardiac disease but she's does have some cardiomegaly on her chest x-ray   No resolved problems to display.  Active Hospital Problems  Diagnoses Date Noted   . Acute systolic congestive heart failure She  was admitted to the hospital as she was in pulmonary edema and was diuresis aggressively. Patient was not compliant with her medications. She was initially started on metoprolol and switched to Coreg 12.5 her heart rate tolerated this well, she was also started on an ACE inhibitor and a diuretic Lasix. The Lasix was initially started his IV, then changed to by mouth her creatinine remained stable her blood pressure tended down slowly and nicely her pulmonary edema resolved. A  2-D echo was done that showed an ejection fraction of 25%. Cardiology was consulted they thought this was probably secondary to noncompliance overmedication and uncontrolled hypertension. UDS was done which is pending at the time of this dictation. She will go home on an ACE inhibitor, Lasix and Coreg. I have also advised her she needs a low sodium diet and restrict her fluids. To followup with a bar cardiology in 4 weeks. To get a repeat echo.  03/14/2012   . HTN (hypertension): Pressure under excellent control. She initially came in with malignant hypertension. And pulmonary edema. As she was not taking her medication. Does resolve after initiating her blood pressure medications.  03/14/2012   . Obesity: Counseling son will followup with her primary care Dr. 03/14/2012     Resolved Hospital Problems  Diagnoses Date Noted Date Resolved     Disposition and Follow-up:  Discharge Orders    Future Orders Please Complete By Expires   Diet - low sodium heart healthy      Increase activity slowly         DISCHARGE EXAM:  See  Progress note.  Blood pressure 117/75, pulse 78, temperature 97.9 F (36.6 C), temperature source Oral, resp. rate 18, height 5\' 6"  (1.676 m), weight 109.6 kg (241 lb 10 oz), last menstrual period 03/06/2012, SpO2 98.00%.   Basename 03/15/12 0620 03/14/12 0918 03/14/12 0318  NA 136 -- 141  K 3.8 -- 3.5  CL 102 -- 106  CO2 24 -- --  GLUCOSE 102* -- 110*  BUN 18 -- 13  CREATININE 0.72 -- 0.60  CALCIUM 9.4 -- --  MG -- 1.7 --  PHOS -- -- --   No results found for this basename: AST:2,ALT:2,ALKPHOS:2,BILITOT:2,PROT:2,ALBUMIN:2 in the last 72 hours No results found for this basename: LIPASE:2,AMYLASE:2 in the last 72 hours  Basename 03/14/12 0318 03/14/12 0256  WBC -- 8.1  NEUTROABS -- --  HGB 14.6 12.5  HCT 43.0 37.5  MCV -- 68.4*  PLT -- 407*    Signed: Marinda Elk M.D. 03/15/2012, 11:42 AM

## 2012-03-15 NOTE — Progress Notes (Signed)
Subjective: No cpmplains. Objective: Filed Vitals:   03/14/12 0800 03/14/12 1200 03/14/12 2117 03/15/12 0606  BP: 127/81 119/84 144/71 117/75  Pulse: 96 77 83 78  Temp: 98.2 F (36.8 C) 98.2 F (36.8 C) 98.7 F (37.1 C) 97.9 F (36.6 C)  TempSrc: Oral Oral Oral Oral  Resp: 20 18 18 18   Height:      Weight:      SpO2: 99% 98% 96% 98%   Weight change:  No intake or output data in the 24 hours ending 03/15/12 1125  General: Alert, awake, oriented x3, in no acute distress.  HEENT: No bruits, no goiter. No JVD Heart: Regular rate and rhythm, without murmurs, rubs, gallops.  Lungs: Crackles left side, bilateral air movement.  Abdomen: Soft, nontender, nondistended, positive bowel sounds.  Neuro: Grossly intact, nonfocal.   Lab Results:  Basename 03/15/12 0620 03/14/12 0918 03/14/12 0318  NA 136 -- 141  K 3.8 -- 3.5  CL 102 -- 106  CO2 24 -- --  GLUCOSE 102* -- 110*  BUN 18 -- 13  CREATININE 0.72 -- 0.60  CALCIUM 9.4 -- --  MG -- 1.7 --  PHOS -- -- --   No results found for this basename: AST:2,ALT:2,ALKPHOS:2,BILITOT:2,PROT:2,ALBUMIN:2 in the last 72 hours No results found for this basename: LIPASE:2,AMYLASE:2 in the last 72 hours  Basename 03/14/12 0318 03/14/12 0256  WBC -- 8.1  NEUTROABS -- --  HGB 14.6 12.5  HCT 43.0 37.5  MCV -- 68.4*  PLT -- 407*    Basename 03/14/12 2335 03/14/12 1616 03/14/12 0918  CKTOTAL 195* 172 159  CKMB 2.2 2.5 2.8  CKMBINDEX -- -- --  TROPONINI <0.30 <0.30 <0.30   No components found with this basename: POCBNP:3  Basename 03/14/12 0256  DDIMER 0.62*   No results found for this basename: HGBA1C:2 in the last 72 hours No results found for this basename: CHOL:2,HDL:2,LDLCALC:2,TRIG:2,CHOLHDL:2,LDLDIRECT:2 in the last 72 hours  Basename 03/14/12 0918  TSH 1.132  T4TOTAL --  T3FREE --  THYROIDAB --   No results found for this basename: VITAMINB12:2,FOLATE:2,FERRITIN:2,TIBC:2,IRON:2,RETICCTPCT:2 in the last 72  hours  Micro Results: No results found for this or any previous visit (from the past 240 hour(s)).  Studies/Results: Dg Chest 2 View  03/14/2012  *RADIOLOGY REPORT*  Clinical Data: Cough and shortness of breath  CHEST - 2 VIEW  Comparison: None.  Findings: Slightly shallow inspiration.  Cardiac enlargement with prominent pulmonary vascularity and hazy infiltrates suggesting edema.  Superimposed consolidation or pneumonia in the right middle lung is not excluded.  No blunting of costophrenic angles.  No pneumothorax.  IMPRESSION: Cardiac enlargement with pulmonary vascular congestion and mild edema.  Possible superimposed infiltration in the right middle lung.  Original Report Authenticated By: Marlon Pel, M.D.    Medications: I have reviewed the patient's current medications.  Assessment and plan: Active Problems:  Acute systolic congestive heart failure: -Good diuresis no JVD Lung cleared. -On ACE, Beta blockers and diuretics. Will need spironolactone as an outpatient.  -follow up with cardiology 2-4 weeks. -blood pressure has come down nicely. -fluid restriction, low salt diet.   HTN (hypertension) -Good controlled -will need titration as an outpatient. Obesity Counseling.     LOS: 1 day   Marinda Elk M.D. Pager: [phone] [facility] 03/15/2012, 11:25 AM

## 2012-03-16 NOTE — Progress Notes (Signed)
   CARE MANAGEMENT NOTE 03/16/2012  Patient:  Patricia Reynolds, Patricia Reynolds   Account Number:  [id number]  Date Initiated:  03/15/2012  Documentation initiated by:  Icyss Skog  Subjective/Objective Assessment:   26 yr-old female adm with CHF; lives with SO and child, independent PTA.         DC Planning Services  CM consult           Status of service:  Completed, signed off  Discharge Disposition:  HOME/SELF CARE  Comments:  03/15/12 Verdis Prime RN MSN CCM Received CM referral for heart failure home health screen. Pt is not homebound.

## 2012-04-11 ENCOUNTER — Encounter: Payer: Self-pay | Admitting: Cardiology

## 2012-04-11 ENCOUNTER — Ambulatory Visit (INDEPENDENT_AMBULATORY_CARE_PROVIDER_SITE_OTHER): Payer: Medicaid Other | Admitting: Cardiology

## 2012-04-11 VITALS — BP 151/90 | HR 91 | Resp 18 | Ht 66.0 in | Wt 236.4 lb

## 2012-04-11 DIAGNOSIS — I10 Essential (primary) hypertension: Secondary | ICD-10-CM

## 2012-04-11 DIAGNOSIS — I5021 Acute systolic (congestive) heart failure: Secondary | ICD-10-CM

## 2012-04-11 DIAGNOSIS — I509 Heart failure, unspecified: Secondary | ICD-10-CM

## 2012-04-11 DIAGNOSIS — E669 Obesity, unspecified: Secondary | ICD-10-CM

## 2012-04-11 MED ORDER — LISINOPRIL 40 MG PO TABS: 40.0000 mg | ORAL_TABLET | Freq: Every day | ORAL | Status: DC

## 2012-04-11 NOTE — Assessment & Plan Note (Signed)
I prescribed [place] or the [employer]

## 2012-04-11 NOTE — Progress Notes (Signed)
   HPI The patient presents as a followup. She was in the hospital for shortness of breath and was found to have an ejection fraction of 25%. This was a global hypokinesis and felt to be related to poorly controlled hypertension. Interestingly she 6 months out from her first and only pregnancy. She had no problems with this. Since being treated and seen in consultation by our service she has felt much better and back to normal. She's not overly active though she's taking care of her child. She does some vacuuming. With this level of activity she denies any shortness of breath, PND or orthopnea. She has no chest pressure, neck or arm discomfort. She's not having any palpitations, presyncope or syncope. She's had no edema. She is watching salt.  Allergies  Allergen Reactions  . Darvocet (Propoxyphene-Acetaminophen) Nausea And Vomiting    Current Outpatient Prescriptions  Medication Sig Dispense Refill  . carvedilol (COREG) 12.5 MG tablet Take 1 tablet (12.5 mg total) by mouth 2 (two) times daily with a meal.  60 tablet  0  . furosemide (LASIX) 40 MG tablet Take 1 tablet (40 mg total) by mouth daily.  30 tablet  0  . lisinopril (PRINIVIL,ZESTRIL) 20 MG tablet Take 1 tablet (20 mg total) by mouth daily.  30 tablet  0  . potassium chloride SA (K-DUR,KLOR-CON) 20 MEQ tablet Take 1 tablet (20 mEq total) by mouth 2 (two) times daily.  30 tablet  0  . diphenhydrAMINE (BENADRYL) 25 MG tablet Take 25 mg by mouth every 6 (six) hours as needed. For allergies        Past Medical History  Diagnosis Date  . Hypertension   . Maternal anemia complicating pregnancy, childbirth, or the puerperium 09/22/2011    Past Surgical History  Procedure Date  . Finger surgery   . Cesarean section 09/21/2011    Procedure: CESAREAN SECTION;  Surgeon: Roseanna Rainbow, MD;  Location: [facility];  Service: Gynecology;  Laterality: N/A;    ROS: As stated in the HPI and negative for all other systems.   PHYSICAL  EXAM BP 151/90  Pulse 91  Resp 18  Ht 5\' 6"  (1.676 m)  Wt 236 lb 6.4 oz (107.23 kg)  BMI 38.16 kg/m2  LMP 03/06/2012 GENERAL:  Well appearing HEENT:  Pupils equal round and reactive, fundi not visualized, oral mucosa unremarkable NECK:  No jugular venous distention, waveform within normal limits, carotid upstroke brisk and symmetric, no bruits, no thyromegaly LYMPHATICS:  No cervical, inguinal adenopathy LUNGS:  Clear to auscultation bilaterally BACK:  No CVA tenderness CHEST:  Unremarkable HEART:  PMI not displaced or sustained,S1 and S2 within normal limits, no S3, no S4, no clicks, no rubs, no murmurs ABD:  Flat, positive bowel sounds normal in frequency in pitch, no bruits, no rebound, no guarding, no midline pulsatile mass, no hepatomegaly, no splenomegaly, obese EXT:  2 plus pulses throughout, no edema, no cyanosis no clubbing SKIN:  No rashes no nodules NEURO:  Cranial nerves II through XII grossly intact, motor grossly intact throughout [facility]:  Cognitively intact, oriented to person place and time  EKG:  Sinus rhythm, right axis deviation, poor anterior R wave progression, rate 102, QTC prolonged, no acute ST-T wave changes 03/14/12  ASSESSMENT AND PLAN

## 2012-04-11 NOTE — Assessment & Plan Note (Signed)
She seems to be euvolemic.  At this point, no change in therapy is indicated.  We have reviewed salt and fluid restrictions.  No further cardiovascular testing is indicated.  I did caution her about the importance of not getting pregnant with her week heart and watches taking ACE inhibitors. She voices understanding.  I will continue to titrate her medications.

## 2012-04-11 NOTE — Patient Instructions (Signed)
Please increase your lisinopril to 40 mg a day.  Continue all other medications as listed  Follow up with Lorin Picket or Thayer Ohm in 1 month.

## 2012-04-11 NOTE — Assessment & Plan Note (Signed)
Her blood pressure is not well controlled. I will increase her lisinopril to 40 mg daily. She can't get a basic metabolic profile when she returns. We discussed weight loss. If her blood pressure is not easily controlled with weight loss and medications I will consider further investigation to include renal Dopplers, renin and aldosterone levels. I have a low suspicion for secondary cause.

## 2012-04-14 ENCOUNTER — Other Ambulatory Visit: Payer: Self-pay | Admitting: Cardiology

## 2012-04-14 MED ORDER — FUROSEMIDE 40 MG PO TABS: 40.0000 mg | ORAL_TABLET | Freq: Every day | ORAL | Status: DC

## 2012-04-14 MED ORDER — CARVEDILOL 12.5 MG PO TABS: 12.5000 mg | ORAL_TABLET | Freq: Two times a day (BID) | ORAL | Status: DC

## 2012-04-14 NOTE — Telephone Encounter (Signed)
Refill-Furosemide (LASIX) 40 MG tablet          -Carvedilol (COREG) 12.5 MG tablet  Patient request refill on RX dispenced by ER doc, verified preferred as Patricia Reynolds Dr.   Please contact patient if these RX cannot be filled.

## 2012-05-16 ENCOUNTER — Ambulatory Visit: Payer: Self-pay | Admitting: Physician Assistant

## 2012-05-31 ENCOUNTER — Ambulatory Visit (INDEPENDENT_AMBULATORY_CARE_PROVIDER_SITE_OTHER): Payer: Medicaid Other | Admitting: Physician Assistant

## 2012-05-31 ENCOUNTER — Encounter: Payer: Self-pay | Admitting: Physician Assistant

## 2012-05-31 VITALS — BP 136/82 | HR 72 | Ht 66.0 in | Wt 238.0 lb

## 2012-05-31 DIAGNOSIS — I10 Essential (primary) hypertension: Secondary | ICD-10-CM

## 2012-05-31 DIAGNOSIS — I509 Heart failure, unspecified: Secondary | ICD-10-CM

## 2012-05-31 DIAGNOSIS — I5022 Chronic systolic (congestive) heart failure: Secondary | ICD-10-CM

## 2012-05-31 DIAGNOSIS — I5021 Acute systolic (congestive) heart failure: Secondary | ICD-10-CM

## 2012-05-31 LAB — BASIC METABOLIC PANEL
CO2: 25 mEq/L (ref 19–32)
Calcium: 9.4 mg/dL (ref 8.4–10.5)
Chloride: 103 mEq/L (ref 96–112)
Creatinine, Ser: 0.8 mg/dL (ref 0.4–1.2)
Glucose, Bld: 90 mg/dL (ref 70–99)

## 2012-05-31 MED ORDER — POTASSIUM CHLORIDE CRYS ER 20 MEQ PO TBCR: 20.0000 meq | EXTENDED_RELEASE_TABLET | Freq: Every day | ORAL | Status: DC

## 2012-05-31 MED ORDER — SPIRONOLACTONE 25 MG PO TABS: 25.0000 mg | ORAL_TABLET | Freq: Every day | ORAL | Status: DC

## 2012-05-31 NOTE — Progress Notes (Signed)
[address] [place], [place]  [address] Phone: [phone] Fax:  [phone]  Date:  05/31/2012   Name:  Patricia Reynolds   DOB:  1986/08/06   MRN:  [id number]  PCP:  Sheila Oats, MD  Primary Cardiologist:  Dr. Rollene Rotunda  Primary Electrophysiologist:  None    History of Present Illness: Patricia Reynolds is a 26 y.o. female who returns for follow up.  She has a history of systolic CHF 2/2 nonischemic cardiomyopathy occurring 6 months out from her first and only pregnancy.  She also has poorly controlled hypertension.  Echo 03/14/12: Mild LVH, EF 20-25%, grade 2 diastolic dysfunction, mild LAE.  She saw Dr. Antoine Poche on 04/16/12.  Volume at that time seemed to be stable.  Future pregnancies have been deemed too risky.  He adjusted her ACE inhibitor and asked that she follow up today for further medication titration.  Overall doing well.  She did have one episode of chest pain a couple of weeks ago.  This was nonexertional and she thinks it was gas.  Otherwise she denies significant shortness of breath.  She probably describes class 1-2 symptoms.  She denies Orthopnea, PND or edema.  She denies syncope.  She is tolerating all her medications well.  Wt Readings from Last 3 Encounters:  05/31/12 238 lb (107.956 kg)  04/11/12 236 lb 6.4 oz (107.23 kg)  03/14/12 241 lb 10 oz (109.6 kg)     Potassium  Date/Time Value Range Status  03/15/2012  6:20 AM 3.8  3.5 - 5.1 mEq/L Final     Creatinine, Ser  Date/Time Value Range Status  03/15/2012  6:20 AM 0.72  0.50 - 1.10 mg/dL Final    Past Medical History  Diagnosis Date  . Hypertension   . Maternal anemia complicating pregnancy, childbirth, or the puerperium 09/22/2011  . Chronic systolic heart failure 03/14/2012    Echo 03/14/12: Mild LVH, EF 20-25%, grade 2 diastolic dysfunction, mild LAE.  She saw Dr. Antoine Poche on 04/16/12.  Marland Kitchen NICM (nonischemic cardiomyopathy)     Current Outpatient Prescriptions    Medication Sig Dispense Refill  . carvedilol (COREG) 12.5 MG tablet Take 1 tablet (12.5 mg total) by mouth 2 (two) times daily with a meal.  60 tablet  5  . diphenhydrAMINE (BENADRYL) 25 MG tablet Take 25 mg by mouth every 6 (six) hours as needed. For allergies      . furosemide (LASIX) 40 MG tablet Take 1 tablet (40 mg total) by mouth daily.  30 tablet  5  . lisinopril (PRINIVIL,ZESTRIL) 40 MG tablet Take 1 tablet (40 mg total) by mouth daily.  30 tablet  11  . potassium chloride SA (K-DUR,KLOR-CON) 20 MEQ tablet Take 1 tablet (20 mEq total) by mouth 2 (two) times daily.  30 tablet  0    Allergies: Allergies  Allergen Reactions  . Darvocet (Propoxyphene-Acetaminophen) Nausea And Vomiting    History  Substance Use Topics  . Smoking status: Never Smoker   . Smokeless tobacco: Never Used  . Alcohol Use: No     ROS:  Please see the history of present illness.    All other systems reviewed and negative.   PHYSICAL EXAM: VS:  BP 136/82  Pulse 72  Ht 5\' 6"  (1.676 m)  Wt 238 lb (107.956 kg)  BMI 38.41 kg/m2 Well nourished, well developed, in no acute distress HEENT: normal Neck: no JVD Cardiac:  normal S1, S2; RRR; no murmur Lungs:  clear to auscultation bilaterally, no  wheezing, rhonchi or rales Abd: soft, nontender, no hepatomegaly Ext: no edema Skin: warm and dry Neuro:  CNs 2-12 intact, no focal abnormalities noted  EKG:  Sinus bradycardia, rate 55, PACs, nonspecific ST-T wave changes      ASSESSMENT AND PLAN:  1.  Chronic systolic congestive heart failure Volume is stable. She is on a stable regimen of beta blocker and ACE inhibitor.  There is no room for further titration of these medications. Her blood pressure would be able to tolerate the addition of spironolactone. I will decrease potassium to 20 mEq daily. Add spironolactone 25 mg daily. Check a basic metabolic panel today.  Repeat in 5 days.  Repeat basic metabolic panel again in 12 days from now. Followup  with me in one month. Plan repeat echocardiogram in October and followup with Dr. Antoine Poche thereafter.  2.  Hypertension Much better controlled.  Luna Glasgow, PA-C  12:09 PM 05/31/2012

## 2012-05-31 NOTE — Patient Instructions (Addendum)
Your physician has requested that you have an echocardiogram. Echocardiography is a painless test that uses sound waves to create images of your heart. It provides your doctor with information about the size and shape of your heart and how well your heart's chambers and valves are working. This procedure takes approximately one hour. There are no restrictions for this procedure.   Your physician recommends that you schedule a follow-up appointment in: 1 MONTH WITH SCOTT WEAVER, [facility]  Your physician recommends that you schedule a follow-up appointment in: WITH DR. HOCHREIN AFTER YOUR ECHO HAS BEEN DONE  Your physician recommends that you return for lab work in: TODAY BMET  Your physician recommends that you return for lab work in: 06/05/12 REPEAT  BMET  Your physician recommends that you return for lab work in: 06/12/12 REPEAT BMET

## 2012-06-05 ENCOUNTER — Other Ambulatory Visit: Payer: Medicaid Other

## 2012-06-12 ENCOUNTER — Other Ambulatory Visit: Payer: Medicaid Other

## 2012-07-03 ENCOUNTER — Ambulatory Visit (INDEPENDENT_AMBULATORY_CARE_PROVIDER_SITE_OTHER): Payer: Medicaid Other | Admitting: Physician Assistant

## 2012-07-03 ENCOUNTER — Encounter: Payer: Self-pay | Admitting: Physician Assistant

## 2012-07-03 VITALS — BP 138/80 | HR 65 | Ht 66.0 in | Wt 241.1 lb

## 2012-07-03 DIAGNOSIS — I509 Heart failure, unspecified: Secondary | ICD-10-CM

## 2012-07-03 DIAGNOSIS — I10 Essential (primary) hypertension: Secondary | ICD-10-CM

## 2012-07-03 DIAGNOSIS — I5021 Acute systolic (congestive) heart failure: Secondary | ICD-10-CM | POA: Insufficient documentation

## 2012-07-03 DIAGNOSIS — I5022 Chronic systolic (congestive) heart failure: Secondary | ICD-10-CM

## 2012-07-03 DIAGNOSIS — R079 Chest pain, unspecified: Secondary | ICD-10-CM

## 2012-07-03 LAB — BASIC METABOLIC PANEL: Calcium: 9.4 mg/dL (ref 8.4–10.5)

## 2012-07-03 MED ORDER — SPIRONOLACTONE 25 MG PO TABS: 25.0000 mg | ORAL_TABLET | Freq: Every day | ORAL | Status: DC

## 2012-07-03 MED ORDER — POTASSIUM CHLORIDE CRYS ER 20 MEQ PO TBCR: 20.0000 meq | EXTENDED_RELEASE_TABLET | Freq: Every day | ORAL | Status: DC

## 2012-07-03 NOTE — Patient Instructions (Addendum)
Pick up and start sprinolactone today and take medications as listed on the list  Your physician recommends that you have lab work in today a bmet  Your physician recommends that you return for lab work in: on August 16th for a bmet and August 23rd for another bmet  Your physician has requested that you have an exercise tolerance test. For further information please visit [email or web address] Please also follow instruction sheet, as given. With Dr. Antoine Poche in 1 to 2 weeks. Hold furosemide until after test.  Keep echo appointment on October 7th and Dr. Antoine Poche appointment October 21st.

## 2012-07-03 NOTE — Progress Notes (Signed)
[address] [place], [place]  [address] Phone: [phone] Fax:  [phone]  Date:  07/03/2012   Name:  Patricia Reynolds   DOB:  1986-08-24   MRN:  [id number]  PCP:  Sheila Oats, MD  Primary Cardiologist:  Dr. Rollene Rotunda  Primary Electrophysiologist:  None    History of Present Illness: Patricia Reynolds is a 26 y.o. female who returns for follow up.  She has a history of systolic CHF 2/2 nonischemic cardiomyopathy occurring 6 months out from her first and only pregnancy.  She also has poorly controlled hypertension.  Echo 03/14/12: Mild LVH, EF 20-25%, grade 2 diastolic dysfunction, mild LAE.  Future pregnancies have been deemed too risky.  I saw her 05/31/12 in followup. I placed her on spironolactone.   Unfortunately, she never picked up the spironolactone.  She has had some atypical chest pain. This occurs when she is walking at the gym. It is sharp and brief. She denies any chest heaviness or tightness. She describes class II symptoms. She denies syncope. She denies orthopnea, PND or edema.   Wt Readings from Last 3 Encounters:  07/03/12 241 lb 1.9 oz (109.371 kg)  05/31/12 238 lb (107.956 kg)  04/11/12 236 lb 6.4 oz (107.23 kg)    Potassium  Date/Time Value Range Status  05/31/2012 12:36 PM 4.0  3.5 - 5.1 mEq/L Final  03/15/2012  6:20 AM 3.8  3.5 - 5.1 mEq/L Final   Creatinine, Ser  Date/Time Value Range Status  05/31/2012 12:36 PM 0.8  0.4 - 1.2 mg/dL Final  3/47/4259  5:63 AM 0.72  0.50 - 1.10 mg/dL Final    Past Medical History  Diagnosis Date  . Hypertension   . Maternal anemia complicating pregnancy, childbirth, or the puerperium 09/22/2011  . Chronic systolic heart failure 03/14/2012    Echo 03/14/12: Mild LVH, EF 20-25%, grade 2 diastolic dysfunction, mild LAE.  She saw Dr. Antoine Poche on 04/16/12.  Marland Kitchen NICM (nonischemic cardiomyopathy)     Current Outpatient Prescriptions  Medication Sig Dispense Refill  . carvedilol (COREG) 12.5  MG tablet Take 1 tablet (12.5 mg total) by mouth 2 (two) times daily with a meal.  60 tablet  5  . diphenhydrAMINE (BENADRYL) 25 MG tablet Take 25 mg by mouth every 6 (six) hours as needed. For allergies      . furosemide (LASIX) 40 MG tablet Take 1 tablet (40 mg total) by mouth daily.  30 tablet  5  . lisinopril (PRINIVIL,ZESTRIL) 40 MG tablet Take 1 tablet (40 mg total) by mouth daily.  30 tablet  11  . potassium chloride SA (K-DUR,KLOR-CON) 20 MEQ tablet Take 1 tablet (20 mEq total) by mouth daily.  30 tablet  6  . spironolactone (ALDACTONE) 25 MG tablet Take 1 tablet (25 mg total) by mouth daily.  30 tablet  6  . DISCONTD: potassium chloride SA (K-DUR,KLOR-CON) 20 MEQ tablet Take 1 tablet (20 mEq total) by mouth daily.  30 tablet  6  . DISCONTD: spironolactone (ALDACTONE) 25 MG tablet Take 1 tablet (25 mg total) by mouth daily.  30 tablet  6    Allergies: Allergies  Allergen Reactions  . Darvocet (Propoxyphene-Acetaminophen) Nausea And Vomiting    History  Substance Use Topics  . Smoking status: Never Smoker   . Smokeless tobacco: Never Used  . Alcohol Use: No     ROS:  Please see the history of present illness.  She has a cough over the last week that is productive  of clear sputum. No fevers. No hemoptysis.   All other systems reviewed and negative.   PHYSICAL EXAM: VS:  BP 138/80  Pulse 65  Ht 5\' 6"  (1.676 m)  Wt 241 lb 1.9 oz (109.371 kg)  BMI 38.92 kg/m2  SpO2 99%  Repeat blood pressure by me 120/96   Well nourished, well developed, in no acute distress HEENT: normal Neck: no JVD Cardiac:  normal S1, S2; RRR; no murmur Chest: No tenderness to palpation  Lungs:  clear to auscultation bilaterally, no wheezing, rhonchi or rales Abd: soft, nontender, no hepatomegaly Ext: no edema Skin: warm and dry Neuro:  CNs 2-12 intact, no focal abnormalities noted  EKG:     Sinus bradycardia, heart rate 56, normal axis, nonspecific ST-T wave changes    ASSESSMENT AND  PLAN:  1.  Chronic Systolic Congestive Heart Failure Volume is stable. I have asked her to go ahead and start spironolactone. She will have a basic metabolic panel drawn today as well as a repeat basic metabolic panel in 5 and then 12 days. Followup with Dr. Antoine Poche after her repeat echocardiogram in October as scheduled.  2. Chest Pain Atypical. This is likely related to recent cough. We discussed conservative measures for her cough.  Suspect viral or allergic etiology.  Her lungs are clear and I do not believe that she needs a chest x-ray. Likelihood of obstructive coronary disease is quite low. I will bring her back for a plain exercise treadmill test.  3. Hypertension  Adjust spironolactone as noted.   SignedTereso Newcomer, PA-C  10:06 AM 07/03/2012

## 2012-07-07 ENCOUNTER — Ambulatory Visit (INDEPENDENT_AMBULATORY_CARE_PROVIDER_SITE_OTHER): Payer: Medicaid Other | Admitting: Nurse Practitioner

## 2012-07-07 ENCOUNTER — Encounter: Payer: Self-pay | Admitting: Nurse Practitioner

## 2012-07-07 ENCOUNTER — Other Ambulatory Visit (INDEPENDENT_AMBULATORY_CARE_PROVIDER_SITE_OTHER): Payer: Medicaid Other

## 2012-07-07 DIAGNOSIS — I10 Essential (primary) hypertension: Secondary | ICD-10-CM

## 2012-07-07 DIAGNOSIS — I5022 Chronic systolic (congestive) heart failure: Secondary | ICD-10-CM

## 2012-07-07 DIAGNOSIS — I5021 Acute systolic (congestive) heart failure: Secondary | ICD-10-CM

## 2012-07-07 DIAGNOSIS — R079 Chest pain, unspecified: Secondary | ICD-10-CM

## 2012-07-07 DIAGNOSIS — I509 Heart failure, unspecified: Secondary | ICD-10-CM

## 2012-07-07 LAB — BASIC METABOLIC PANEL
BUN: 11 mg/dL (ref 6–23)
CO2: 24 mEq/L (ref 19–32)
Chloride: 103 mEq/L (ref 96–112)
Potassium: 4.4 mEq/L (ref 3.5–5.1)

## 2012-07-07 NOTE — Procedures (Signed)
Exercise Treadmill Test  Pre-Exercise Testing Evaluation Rhythm: normal sinus  Rate: 60   PR:  .18 QRS:  .08  QT:  .43 QTc: .43     Test  Exercise Tolerance Test Ordering MD: Angelina Sheriff, MD  Interpreting MD: Ward Givens , NP  Unique Test No: 1  Treadmill:  1  Indication for ETT: chest pain - rule out ischemia  Contraindication to ETT: No   Stress Modality: exercise - treadmill  Cardiac Imaging Performed: non   Protocol: standard Bruce - maximal  Max BP: 169/89  Max MPHR (bpm):  195 85% MPR (bpm):  166  MPHR obtained (bpm):  171 % MPHR obtained:  87%  Reached 85% MPHR (min:sec): 5:23 Total Exercise Time (min-sec):  6:11  Workload in METS:  7.2 Borg Scale: 13  Reason ETT Terminated:  dyspnea    ST Segment Analysis At Rest: normal ST segments - no evidence of significant ST depression With Exercise: no evidence of significant ST depression  Other Information Arrhythmia:  2 pvc's during recovery. Angina during ETT:  absent (0) Quality of ETT:  diagnostic  ETT Interpretation:  normal - no evidence of ischemia by ST analysis  Comments: No chest pain.  Test stopped 2/2 dyspnea.  No acute st/t changes.  HR reduced to 150 within 15 seconds of stopping the treadmill.  Recommendations: She will cont to walk 2 miles daily as she had previously been doing.  She has echo arranged for October along with f/u with Dr. Antoine Poche @ that time.

## 2012-07-14 ENCOUNTER — Other Ambulatory Visit (INDEPENDENT_AMBULATORY_CARE_PROVIDER_SITE_OTHER): Payer: Medicaid Other

## 2012-07-14 DIAGNOSIS — I10 Essential (primary) hypertension: Secondary | ICD-10-CM

## 2012-07-14 DIAGNOSIS — R079 Chest pain, unspecified: Secondary | ICD-10-CM

## 2012-07-14 DIAGNOSIS — I5022 Chronic systolic (congestive) heart failure: Secondary | ICD-10-CM

## 2012-07-14 DIAGNOSIS — I5021 Acute systolic (congestive) heart failure: Secondary | ICD-10-CM

## 2012-07-14 DIAGNOSIS — I509 Heart failure, unspecified: Secondary | ICD-10-CM

## 2012-07-14 LAB — BASIC METABOLIC PANEL
CO2: 27 mEq/L (ref 19–32)
Calcium: 9.4 mg/dL (ref 8.4–10.5)
Glucose, Bld: 89 mg/dL (ref 70–99)
Sodium: 138 mEq/L (ref 135–145)

## 2012-07-17 ENCOUNTER — Telehealth: Payer: Self-pay | Admitting: *Deleted

## 2012-07-17 NOTE — Telephone Encounter (Signed)
Message copied by Tarri Fuller on Mon Jul 17, 2012  3:25 PM ------      Message from: [place], [place] T      Created: Sun Jul 16, 2012  6:40 AM       K+ and renal fxn ok.      Continue with current treatment plan.      Tereso Newcomer, PA-C  6:40 AM 07/16/2012

## 2012-07-17 NOTE — Telephone Encounter (Signed)
pt notified of lab results and ETT results as well, pt gave verbal understanding to me today about all results

## 2012-08-28 ENCOUNTER — Ambulatory Visit (HOSPITAL_COMMUNITY): Payer: Medicaid Other | Attending: Cardiology | Admitting: Radiology

## 2012-08-28 ENCOUNTER — Other Ambulatory Visit: Payer: Self-pay

## 2012-08-28 DIAGNOSIS — I428 Other cardiomyopathies: Secondary | ICD-10-CM | POA: Insufficient documentation

## 2012-08-28 DIAGNOSIS — I509 Heart failure, unspecified: Secondary | ICD-10-CM | POA: Insufficient documentation

## 2012-08-28 DIAGNOSIS — I10 Essential (primary) hypertension: Secondary | ICD-10-CM | POA: Insufficient documentation

## 2012-08-28 DIAGNOSIS — I5021 Acute systolic (congestive) heart failure: Secondary | ICD-10-CM

## 2012-08-28 DIAGNOSIS — I369 Nonrheumatic tricuspid valve disorder, unspecified: Secondary | ICD-10-CM | POA: Insufficient documentation

## 2012-08-28 NOTE — Progress Notes (Signed)
Echocardiogram performed.  

## 2012-09-04 ENCOUNTER — Telehealth: Payer: Self-pay | Admitting: *Deleted

## 2012-09-04 NOTE — Telephone Encounter (Signed)
lmom echo results; LV function improved

## 2012-09-04 NOTE — Telephone Encounter (Signed)
Message copied by Tarri Fuller on Mon Sep 04, 2012  9:38 AM ------      Message from: Rollene Rotunda      Created: Sun Sep 03, 2012  8:17 PM       LV function is much improved.  Call Ms. Gudiel with the results.  No change in therapy.

## 2012-09-11 ENCOUNTER — Encounter: Payer: Self-pay | Admitting: Cardiology

## 2012-09-11 ENCOUNTER — Ambulatory Visit (INDEPENDENT_AMBULATORY_CARE_PROVIDER_SITE_OTHER): Payer: Medicaid Other | Admitting: Cardiology

## 2012-09-11 VITALS — BP 130/85 | HR 92 | Ht 66.0 in | Wt 245.0 lb

## 2012-09-11 DIAGNOSIS — I5021 Acute systolic (congestive) heart failure: Secondary | ICD-10-CM

## 2012-09-11 DIAGNOSIS — I509 Heart failure, unspecified: Secondary | ICD-10-CM

## 2012-09-11 DIAGNOSIS — E669 Obesity, unspecified: Secondary | ICD-10-CM

## 2012-09-11 DIAGNOSIS — I5022 Chronic systolic (congestive) heart failure: Secondary | ICD-10-CM

## 2012-09-11 DIAGNOSIS — I10 Essential (primary) hypertension: Secondary | ICD-10-CM

## 2012-09-11 NOTE — Progress Notes (Signed)
HPI The patient presents as a followup. Since she was last seen she had a repeat echo which demonstrates that her ejection fraction is improved from 25% to 45% %. I reviewed this with her at length. She says she's exercising routinely. The patient denies any new symptoms such as chest discomfort, neck or arm discomfort. There has been no new shortness of breath, PND or orthopnea. There have been no reported palpitations, presyncope or syncope.    Allergies  Allergen Reactions  . Darvocet (Propoxyphene-Acetaminophen) Nausea And Vomiting    Current Outpatient Prescriptions  Medication Sig Dispense Refill  . carvedilol (COREG) 12.5 MG tablet Take 1 tablet (12.5 mg total) by mouth 2 (two) times daily with a meal.  60 tablet  5  . diphenhydrAMINE (BENADRYL) 25 MG tablet Take 25 mg by mouth every 6 (six) hours as needed. For allergies      . furosemide (LASIX) 40 MG tablet Take 1 tablet (40 mg total) by mouth daily.  30 tablet  5  . lisinopril (PRINIVIL,ZESTRIL) 40 MG tablet Take 1 tablet (40 mg total) by mouth daily.  30 tablet  11  . potassium chloride SA (K-DUR,KLOR-CON) 20 MEQ tablet Take 1 tablet (20 mEq total) by mouth daily.  30 tablet  6  . spironolactone (ALDACTONE) 25 MG tablet Take 1 tablet (25 mg total) by mouth daily.  30 tablet  6    Past Medical History  Diagnosis Date  . Hypertension   . Maternal anemia complicating pregnancy, childbirth, or the puerperium 09/22/2011  . Chronic systolic heart failure 03/14/2012    Echo 03/14/12: Mild LVH, EF 20-25%, grade 2 diastolic dysfunction, mild LAE.  She saw Dr. Antoine Poche on 04/16/12.  Marland Kitchen NICM (nonischemic cardiomyopathy)     Past Surgical History  Procedure Date  . Finger surgery   . Cesarean section 09/21/2011    Procedure: CESAREAN SECTION;  Surgeon: Roseanna Rainbow, MD;  Location: [facility];  Service: Gynecology;  Laterality: N/A;    ROS: As stated in the HPI and negative for all other systems.   PHYSICAL EXAM BP 130/85   Pulse 92  Ht 5\' 6"  (1.676 m)  Wt 245 lb (111.131 kg)  BMI 39.54 kg/m2  Breastfeeding? No GENERAL:  Well appearing NECK:  No jugular venous distention, waveform within normal limits, carotid upstroke brisk and symmetric, no bruits, no thyromegaly LUNGS:  Clear to auscultation bilaterally BACK:  No CVA tenderness CHEST:  Unremarkable HEART:  PMI not displaced or sustained,S1 and S2 within normal limits, no S3, no S4, no clicks, no rubs, no murmurs ABD:  Flat, positive bowel sounds normal in frequency in pitch, no bruits, no rebound, no guarding, no midline pulsatile mass, no hepatomegaly, no splenomegaly, obese EXT:  2 plus pulses throughout, no edema, no cyanosis no clubbing   ASSESSMENT AND PLAN  Acute systolic congestive heart failure -  She seems to be euvolemic. At this point, no change in therapy is indicated. We have reviewed salt and fluid restrictions. No further cardiovascular testing is indicated. I did caution her about the importance of not getting pregnant with her week heart and while she is taking ACE inhibitors.  She has no plans to get pregnant. She will get a basic metabolic profile in December.  HTN (hypertension) -  Her blood pressure is not well controlled. Her BP is now well controlled on the meds as listed. No change to therapy is indicated.    Obesity -  We again had a long and  detailed review of strategies for this.

## 2012-09-11 NOTE — Patient Instructions (Addendum)
Your physician wants you to follow-up in: 6 months with Dr. Antoine Poche.  You will receive a reminder letter in the mail two months in advance. If you don't receive a letter, please call our office to schedule the follow-up appointment.  Your physician recommends that you return for lab work in: December 2013 - BMET.

## 2012-11-06 ENCOUNTER — Other Ambulatory Visit (INDEPENDENT_AMBULATORY_CARE_PROVIDER_SITE_OTHER): Payer: Medicaid Other

## 2012-11-06 DIAGNOSIS — I5021 Acute systolic (congestive) heart failure: Secondary | ICD-10-CM

## 2012-11-06 DIAGNOSIS — I509 Heart failure, unspecified: Secondary | ICD-10-CM

## 2012-11-06 LAB — BASIC METABOLIC PANEL
BUN: 12 mg/dL (ref 6–23)
CO2: 24 mEq/L (ref 19–32)
Calcium: 9.3 mg/dL (ref 8.4–10.5)
Creatinine, Ser: 0.7 mg/dL (ref 0.4–1.2)
GFR: 121.92 mL/min (ref >60.00)
Glucose, Bld: 106 mg/dL — ABNORMAL HIGH (ref 70–99)
Sodium: 137 mEq/L (ref 135–145)

## 2013-01-08 ENCOUNTER — Other Ambulatory Visit: Payer: Self-pay | Admitting: Cardiology

## 2013-01-09 NOTE — Telephone Encounter (Signed)
..   Requested Prescriptions   Pending Prescriptions Disp Refills  . furosemide (LASIX) 40 MG tablet [Pharmacy Med Name: FUROSEMIDE 40MG      TAB] 30 tablet 2    Sig: TAKE ONE TABLET BY MOUTH EVERY DAY

## 2013-05-06 ENCOUNTER — Other Ambulatory Visit: Payer: Self-pay | Admitting: Cardiology

## 2013-06-04 ENCOUNTER — Encounter: Payer: Self-pay | Admitting: Physician Assistant

## 2013-06-04 ENCOUNTER — Ambulatory Visit (INDEPENDENT_AMBULATORY_CARE_PROVIDER_SITE_OTHER): Payer: Self-pay | Admitting: Physician Assistant

## 2013-06-04 VITALS — BP 124/82 | HR 54 | Ht 66.0 in | Wt 242.0 lb

## 2013-06-04 DIAGNOSIS — I10 Essential (primary) hypertension: Secondary | ICD-10-CM

## 2013-06-04 DIAGNOSIS — I5022 Chronic systolic (congestive) heart failure: Secondary | ICD-10-CM

## 2013-06-04 LAB — BASIC METABOLIC PANEL
BUN: 11 mg/dL (ref 6–23)
Calcium: 9.7 mg/dL (ref 8.4–10.5)
Chloride: 101 mEq/L (ref 96–112)
Creatinine, Ser: 0.8 mg/dL (ref 0.4–1.2)

## 2013-06-04 NOTE — Progress Notes (Signed)
[address] [place], [place]  [address] Phone: [phone] Fax:  [phone]  Date:  06/04/2013   ID:  Patricia Reynolds, DOB 1986-06-26, MRN [id number]  PCP:  Default, Provider, MD  Cardiologist:  Dr. Rollene Rotunda     History of Present Illness: Patricia Reynolds is a 27 y.o. female who returns for f/u.    She has a history of systolic CHF 2/2 nonischemic cardiomyopathy occurring 6 months out from her first and only pregnancy. She also has poorly controlled hypertension. Echo 03/14/12: Mild LVH, EF 20-25%, grade 2 diastolic dysfunction, mild LAE. Future pregnancies have been deemed too risky.  ETT 06/2102: no ischemic changes.  Last echo 08/2012: EF 45-50%, mild LVH.  Last seen by Dr. Rollene Rotunda in 08/2012.  Since then, she is doing well.  The patient denies chest pain, shortness of breath, syncope, orthopnea, PND or significant pedal edema.   Labs (4/13):    TSH 1.132 Labs (12/13):  K 4.2, Cr 0.7   Wt Readings from Last 3 Encounters:  06/04/13 242 lb (109.77 kg)  09/11/12 245 lb (111.131 kg)  07/03/12 241 lb 1.9 oz (109.371 kg)     Past Medical History  Diagnosis Date  . Hypertension   . Maternal anemia complicating pregnancy, childbirth, or the puerperium 09/22/2011  . Chronic systolic heart failure 03/14/2012    Echo 03/14/12: Mild LVH, EF 20-25%, grade 2 diastolic dysfunction, mild LAE.  She saw Dr. Antoine Poche on 04/16/12.  Marland Kitchen NICM (nonischemic cardiomyopathy)     Current Outpatient Prescriptions  Medication Sig Dispense Refill  . carvedilol (COREG) 12.5 MG tablet TAKE ONE TABLET BY MOUTH TWICE DAILY WITH MEALS  60 tablet  0  . diphenhydrAMINE (BENADRYL) 25 MG tablet Take 25 mg by mouth every 6 (six) hours as needed. For allergies      . furosemide (LASIX) 40 MG tablet TAKE ONE TABLET BY MOUTH ONCE DAILY  30 tablet  0  . lisinopril (PRINIVIL,ZESTRIL) 20 MG tablet TAKE TWO TABLETS BY MOUTH EVERY DAY  60 tablet  0  . spironolactone (ALDACTONE) 25 MG  tablet TAKE ONE TABLET BY MOUTH EVERY DAY  30 tablet  0  . potassium chloride SA (K-DUR,KLOR-CON) 20 MEQ tablet Take 1 tablet (20 mEq total) by mouth daily.  30 tablet  6   No current facility-administered medications for this visit.    Allergies:    Allergies  Allergen Reactions  . Darvocet (Propoxyphene-Acetaminophen) Nausea And Vomiting    Social History:  The patient  reports that she has never smoked. She has never used smokeless tobacco. She reports that she does not drink alcohol or use illicit drugs.   ROS:  Please see the history of present illness.      All other systems reviewed and negative.   PHYSICAL EXAM: VS:  BP 124/82  Pulse 54  Ht 5\' 6"  (1.676 m)  Wt 242 lb (109.77 kg)  BMI 39.08 kg/m2 Well nourished, well developed, in no acute distress HEENT: normal Neck: no JVD Cardiac:  normal S1, S2; RRR; no murmur Lungs:  clear to auscultation bilaterally, no wheezing, rhonchi or rales Abd: soft, nontender, no hepatomegaly Ext: no edema Skin: warm and dry Neuro:  CNs 2-12 intact, no focal abnormalities noted  EKG:  Sinus brady, HR 54, PACs, no change from prior tracing     ASSESSMENT AND PLAN:  1. Chronic Systolic CHF:  Volume stable.  She has not taken K+ in several mos.  She has been  taking OTC K+.  Check BMET today.  Continue current dose of ACE, beta blocker and spironolactone.  2. Hypertension:  Controlled.  Continue current therapy.  3. Disposition:  F/u with Dr. Rollene Rotunda in 6 mos.   Signed, Tereso Newcomer, PA-C  06/04/2013 2:37 PM

## 2013-06-04 NOTE — Patient Instructions (Addendum)
Lab work today [employer] )    Your physician wants you to follow-up in: 6 months with Dr.Hochrein You will receive a reminder letter in the mail two months in advance. If you don't receive a letter, please call our office to schedule the follow-up appointment.

## 2013-06-18 ENCOUNTER — Other Ambulatory Visit: Payer: Self-pay | Admitting: Cardiology

## 2013-11-21 ENCOUNTER — Emergency Department: Payer: Self-pay | Admitting: Emergency Medicine

## 2014-03-15 ENCOUNTER — Other Ambulatory Visit: Payer: Self-pay | Admitting: Cardiology

## 2014-07-30 ENCOUNTER — Other Ambulatory Visit: Payer: Self-pay | Admitting: *Deleted

## 2014-07-30 DIAGNOSIS — I5021 Acute systolic (congestive) heart failure: Secondary | ICD-10-CM

## 2014-07-30 MED ORDER — CARVEDILOL 12.5 MG PO TABS: ORAL_TABLET | ORAL | Status: DC

## 2014-07-30 MED ORDER — FUROSEMIDE 40 MG PO TABS: ORAL_TABLET | ORAL | Status: DC

## 2014-07-30 MED ORDER — LISINOPRIL 20 MG PO TABS: ORAL_TABLET | ORAL | Status: DC

## 2014-07-30 MED ORDER — SPIRONOLACTONE 25 MG PO TABS: ORAL_TABLET | ORAL | Status: DC

## 2014-08-14 ENCOUNTER — Ambulatory Visit (INDEPENDENT_AMBULATORY_CARE_PROVIDER_SITE_OTHER): Payer: Self-pay | Admitting: Physician Assistant

## 2014-08-14 ENCOUNTER — Encounter: Payer: Self-pay | Admitting: Physician Assistant

## 2014-08-14 VITALS — BP 150/78 | HR 79 | Ht 66.0 in | Wt 247.0 lb

## 2014-08-14 DIAGNOSIS — I5022 Chronic systolic (congestive) heart failure: Secondary | ICD-10-CM

## 2014-08-14 DIAGNOSIS — I428 Other cardiomyopathies: Secondary | ICD-10-CM

## 2014-08-14 DIAGNOSIS — I509 Heart failure, unspecified: Secondary | ICD-10-CM

## 2014-08-14 DIAGNOSIS — I10 Essential (primary) hypertension: Secondary | ICD-10-CM

## 2014-08-14 MED ORDER — CARVEDILOL 3.125 MG PO TABS: 3.1250 mg | ORAL_TABLET | Freq: Two times a day (BID) | ORAL | Status: DC

## 2014-08-14 MED ORDER — SPIRONOLACTONE 25 MG PO TABS: ORAL_TABLET | ORAL | Status: DC

## 2014-08-14 MED ORDER — FUROSEMIDE 40 MG PO TABS: ORAL_TABLET | ORAL | Status: DC

## 2014-08-14 MED ORDER — POTASSIUM CHLORIDE CRYS ER 20 MEQ PO TBCR: 20.0000 meq | EXTENDED_RELEASE_TABLET | Freq: Every day | ORAL | Status: DC

## 2014-08-14 MED ORDER — LISINOPRIL 20 MG PO TABS: ORAL_TABLET | ORAL | Status: DC

## 2014-08-14 NOTE — Progress Notes (Signed)
Cardiology Office Note   Date:  08/14/2014   ID:  Patricia Reynolds, DOB 1986-07-23, MRN [id number]  PCP:  Default, Provider, MD  Cardiologist:  Dr. Rollene Rotunda     History of Present Illness: Patricia Reynolds is a 28 y.o. female with a hx of systolic CHF 2/2 nonischemic cardiomyopathy occurring 6 months out from her first and only pregnancy. She also has poorly controlled hypertension. Echo 02/2012 with EF 20-25%.  Future pregnancies have been deemed too risky.  FU echo in 08/2012 with improved LVEF 45-50%.  Last seen by me in 05/2013.  Here for FU.  Out of Coreg x 1 month.  Nicolette Bang has on "back-order."  We called her pharmacy.  They have other strengths available but did not offer this to the patient.  She does note some lightheadedness at times after taking her medications.  She has had less of this since she ran out of Carvedilol.  Otherwise, she is doing well.  The patient denies chest pain, shortness of breath, syncope, orthopnea, PND or significant pedal edema.    Studies:  - Echo 03/14/12: Mild LVH, EF 20-25%, grade 2 diastolic dysfunction, mild LAE.  - Echo 08/2012: EF 45-50%, mild LVH  - ETT 06/2012: no ischemic changes.  Recent Labs/Images:  No results found for requested labs within last 365 days.     Wt Readings from Last 3 Encounters:  08/14/14 247 lb (112.038 kg)  06/04/13 242 lb (109.77 kg)  09/11/12 245 lb (111.131 kg)     Past Medical History  Diagnosis Date  . Hypertension   . Maternal anemia complicating pregnancy, childbirth, or the puerperium 09/22/2011  . Chronic systolic heart failure 03/14/2012    Echo 03/14/12: Mild LVH, EF 20-25%, grade 2 diastolic dysfunction, mild LAE.  She saw Dr. Antoine Poche on 04/16/12.  Marland Kitchen NICM (nonischemic cardiomyopathy)     Current Outpatient Prescriptions  Medication Sig Dispense Refill  . carvedilol (COREG) 12.5 MG tablet TAKE ONE TABLET BY MOUTH TWICE DAILY WITH MEALS  60 tablet  0  . diphenhydrAMINE (BENADRYL) 25 MG  tablet Take 25 mg by mouth every 6 (six) hours as needed. For allergies      . furosemide (LASIX) 40 MG tablet TAKE ONE TABLET BY MOUTH ONCE DAILY  30 tablet  0  . lisinopril (PRINIVIL,ZESTRIL) 20 MG tablet TAKE TWO TABLETS BY MOUTH ONCE DAILY  60 tablet  0  . potassium chloride SA (K-DUR,KLOR-CON) 20 MEQ tablet Take 1 tablet (20 mEq total) by mouth daily.  30 tablet  6  . spironolactone (ALDACTONE) 25 MG tablet TAKE ONE TABLET BY MOUTH ONCE DAILY  30 tablet  0   No current facility-administered medications for this visit.     Allergies:   Darvocet   Social History:  The patient  reports that she has never smoked. She has never used smokeless tobacco. She reports that she does not drink alcohol or use illicit drugs.   Family History:  The patient's family history includes Depression in her maternal grandmother; Diabetes type II in her mother; Hyperlipidemia in her father; Hypertension in her father and mother; Stroke in her paternal grandmother. There is no history of Anesthesia problems, Hypotension, Malignant hyperthermia, Pseudochol deficiency, or Heart attack.   ROS:  Please see the history of present illness.      All other systems reviewed and negative.    PHYSICAL EXAM: VS:  BP 150/78  Pulse 79  Ht  (1.676 m)  Wt 247  lb (112.038 kg)  BMI 39.89 kg/m2 Well nourished, well developed, in no acute distress HEENT: normal Neck: no JVD Cardiac:  normal S1, S2; RRR; no murmur Lungs:  clear to auscultation bilaterally, no wheezing, rhonchi or rales Abd: soft, nontender, no hepatomegaly Ext: no edema Skin: warm and dry Neuro:  CNs 2-12 intact, no focal abnormalities noted  EKG:  NSR, HR 79, PACs, no ST changes      ASSESSMENT AND PLAN:  1. NICM (nonischemic cardiomyopathy):  She likely was having symptomatic bradycardia or hypotension on the higher dose of Coreg.  I will resume at Coreg 3.125 mg bid.  Continue ACEI, Lasix, Spironolactone.  Arrange FU BMET today.   2. Chronic  systolic heart failure:  She is NYHA 2.  Continue current Rx.  Check BMET 3. Essential hypertension:  BP elevated today off of Coreg. Adjust medications as noted above.    Disposition:   FU with me in 4-6 weeks.    Signed, Brynda Rim, MHS 08/14/2014 4:04 PM    [facility] [address] [place], [place], [place]  [address] Phone: [phone]; Fax: [phone]

## 2014-08-14 NOTE — Patient Instructions (Signed)
Your physician recommends that you schedule a follow-up appointment in: 4-6 weeks with Tereso Newcomer, PA  Your physician has recommended you make the following change in your medication:  Decrease Carvedilol to 3.125 mg by mouth twice daily

## 2014-08-15 ENCOUNTER — Telehealth: Payer: Self-pay | Admitting: *Deleted

## 2014-08-15 LAB — BASIC METABOLIC PANEL
BUN: 13 mg/dL (ref 6–23)
CHLORIDE: 104 meq/L (ref 96–112)
CO2: 27 mEq/L (ref 19–32)
CREATININE: 0.7 mg/dL (ref 0.4–1.2)
Calcium: 9.2 mg/dL (ref 8.4–10.5)
GFR: 128.27 mL/min (ref >60.00)
Glucose, Bld: 97 mg/dL (ref 70–99)
Potassium: 4.4 mEq/L (ref 3.5–5.1)
Sodium: 136 mEq/L (ref 135–145)

## 2014-08-15 NOTE — Telephone Encounter (Signed)
lmptcb for lab results 

## 2014-08-21 ENCOUNTER — Encounter: Payer: Self-pay | Admitting: *Deleted

## 2014-08-21 NOTE — Telephone Encounter (Signed)
no answer. I will send out results letter to pt today since I have not been able to reach by phone.

## 2014-09-23 ENCOUNTER — Encounter: Payer: Self-pay | Admitting: Physician Assistant

## 2014-09-23 NOTE — Progress Notes (Deleted)
Cardiology Office Note   Date:  09/23/2014   ID:  Patricia Reynolds, DOB Sep 29, 1986, MRN [id number]  PCP:  Default, Provider, MD  Cardiologist:  Dr. Rollene Rotunda     History of Present Illness: Patricia Reynolds is a 28 y.o. female with a hx of systolic CHF 2/2 nonischemic cardiomyopathy occurring 6 months out from her first and only pregnancy. She also has poorly controlled hypertension. Echo 02/2012 with EF 20-25%.  Future pregnancies have been deemed too risky.  FU echo in 08/2012 with improved LVEF 45-50%.  I saw her 08/14/14 for FU and she was out of Coreg x 1 month.  I resumed her beta blocker.  She returns for FU.  ***   Studies:  - Echo 03/14/12: Mild LVH, EF 20-25%, grade 2 diastolic dysfunction, mild LAE.  - Echo 08/2012: EF 45-50%, mild LVH  - ETT 06/2012: no ischemic changes.   Recent Labs/Images: 08/14/2014: BUN 13; Creatinine 0.7; Potassium 4.4; Sodium 136    Wt Readings from Last 3 Encounters:  08/14/14 247 lb (112.038 kg)  06/04/13 242 lb (109.77 kg)  09/11/12 245 lb (111.131 kg)     Past Medical History  Diagnosis Date  . Hypertension   . Maternal anemia complicating pregnancy, childbirth, or the puerperium 09/22/2011  . Chronic systolic heart failure 03/14/2012    Echo 03/14/12: Mild LVH, EF 20-25%, grade 2 diastolic dysfunction, mild LAE.  She saw Dr. Antoine Reynolds on 04/16/12.  Marland Kitchen NICM (nonischemic cardiomyopathy)     Current Outpatient Prescriptions  Medication Sig Dispense Refill  . carvedilol (COREG) 3.125 MG tablet Take 1 tablet (3.125 mg total) by mouth 2 (two) times daily with a meal. 60 tablet 6  . diphenhydrAMINE (BENADRYL) 25 MG tablet Take 25 mg by mouth every 6 (six) hours as needed. For allergies    . furosemide (LASIX) 40 MG tablet TAKE ONE TABLET BY MOUTH ONCE DAILY 30 tablet 6  . lisinopril (PRINIVIL,ZESTRIL) 20 MG tablet TAKE TWO TABLETS BY MOUTH ONCE DAILY 60 tablet 6  . potassium chloride SA (K-DUR,KLOR-CON) 20 MEQ tablet Take 1 tablet (20  mEq total) by mouth daily. 30 tablet 6  . spironolactone (ALDACTONE) 25 MG tablet TAKE ONE TABLET BY MOUTH ONCE DAILY 30 tablet 6   No current facility-administered medications for this visit.     Allergies:   Darvocet   Social History:  The patient  reports that she has never smoked. She has never used smokeless tobacco. She reports that she does not drink alcohol or use illicit drugs.   Family History:  The patient's family history includes Depression in her maternal grandmother; Diabetes type II in her mother; Hyperlipidemia in her father; Hypertension in her father and mother; Stroke in her paternal grandmother. There is no history of Anesthesia problems, Hypotension, Malignant hyperthermia, Pseudochol deficiency, or Heart attack.   ROS:  Please see the history of present illness.   ***   All other systems reviewed and negative.    PHYSICAL EXAM: VS:  There were no vitals taken for this visit. Well nourished, well developed, in no acute distress HEENT: normal Neck: no JVD Cardiac:  normal S1, S2; RRR; no murmur Lungs:  clear to auscultation bilaterally, no wheezing, rhonchi or rales Abd: soft, nontender, no hepatomegaly Ext: no edema Skin: warm and dry Neuro:  CNs 2-12 intact, no focal abnormalities noted  EKG:  ***      ASSESSMENT AND PLAN:  1.  NICM (nonischemic cardiomyopathy):  ***Continue beta blocker, ACEI,  Lasix, Spironolactone.     2.  Chronic systolic heart failure:  She is NYHA 2.  Continue current Rx.  *** 3.  Essential hypertension:  ***   Disposition:   FU with ***   Signed, Tereso NewcomerScott Marlos Carmen, PA-C, MHS 09/23/2014 1:53 PM    [facility] [address], [place], [place]  [id number] Phone: [phone]; Fax: [phone]   This encounter was created in error - please disregard.

## 2014-09-24 NOTE — Progress Notes (Signed)
Note created in error.  Please disregard. Tereso Newcomer, PA-C   09/24/2014 8:27 AM

## 2017-11-30 ENCOUNTER — Ambulatory Visit (INDEPENDENT_AMBULATORY_CARE_PROVIDER_SITE_OTHER): Payer: Self-pay | Admitting: Emergency Medicine

## 2017-11-30 VITALS — BP 133/80 | HR 87 | Temp 99.2°F | Resp 16 | Wt 250.0 lb

## 2017-11-30 DIAGNOSIS — H6123 Impacted cerumen, bilateral: Secondary | ICD-10-CM

## 2017-11-30 DIAGNOSIS — J40 Bronchitis, not specified as acute or chronic: Secondary | ICD-10-CM

## 2017-11-30 MED ORDER — AZITHROMYCIN 250 MG PO TABS: ORAL_TABLET | ORAL | 0 refills | Status: DC

## 2017-11-30 MED ORDER — BENZONATATE 200 MG PO CAPS: 200.0000 mg | ORAL_CAPSULE | Freq: Three times a day (TID) | ORAL | 0 refills | Status: DC | PRN

## 2017-11-30 MED ORDER — PREDNISONE 20 MG PO TABS: 20.0000 mg | ORAL_TABLET | Freq: Every day | ORAL | 0 refills | Status: DC

## 2017-11-30 NOTE — Progress Notes (Signed)
Subjective:     Patricia Reynolds is a 32 y.o. female who presents for evaluation of symptoms of a URI. Symptoms include bilateral ear pressure/pain, achiness, congestion, facial pain, low grade fever, non productive cough, purulent nasal discharge, sinus pressure, sore throat and wheezing. Onset of symptoms was 2 weeks ago, and has been gradually worsening since that time. Treatment to date: none.  The following portions of the patient's history were reviewed and updated as appropriate: allergies and current medications.  Review of Systems Pertinent items noted in HPI and remainder of comprehensive ROS otherwise negative.   Objective:    BP 133/80   Pulse 87   Temp 99.2 F (37.3 C)   Resp 16   Wt 250 lb (113.4 kg)   SpO2 97%   BMI 40.35 kg/m  General appearance: alert, cooperative and appears stated age Head: Normocephalic, without obvious abnormality, atraumatic Eyes: negative Ears: Bilateral cerumen impaction Nose: mild congestion, turbinates pink, edematous, sinus tenderness bilateral Throat: lips, mucosa, and tongue normal; teeth and gums normal Neck: no adenopathy Lungs: rhonchi bibasilar Heart: regular rate and rhythm Extremities: extremities normal, atraumatic, no cyanosis or edema Pulses: 2+ and symmetric   Assessment:   1. Bilateral impacted cerumen   2. Bronchitis     Plan:  1. Bilateral impacted cerumen OTC debrox, follow up with urgent care if unable to clear  2. Bronchitis OTC Tylenol or ibuprofen, Mucinex DM, rest, plenty of fluids, follow up with PCP in one week or ER if symptoms worsen. - benzonatate (TESSALON) 200 MG capsule; Take 1 capsule (200 mg total) by mouth 3 (three) times daily as needed for cough.  Dispense: 30 capsule; Refill: 0 - predniSONE (DELTASONE) 20 MG tablet; Take 1 tablet (20 mg total) by mouth daily with breakfast.  Dispense: 6 tablet; Refill: 0 - azithromycin (ZITHROMAX) 250 MG tablet; 2 tablets today, then 1 daily till finished   Dispense: 6 tablet; Refill: 0

## 2017-11-30 NOTE — Patient Instructions (Addendum)
Acute Bronchitis, Adult For your cerumen impaction, I recommend an over the counter product called Debrox, use as directed twice a day.  For your bronchitis, in addition to the medicine I prescribed. Tylenol or motrin for fever or pain, Mucinex DM for cough, an antihistamine such as claritin or zyrtec daily. Follow up in one week as needed or the ER anytime symptoms are worse.    Acute bronchitis is when air tubes (bronchi) in the lungs suddenly get swollen. The condition can make it hard to breathe. It can also cause these symptoms:  A cough.  Coughing up clear, yellow, or green mucus.  Wheezing.  Chest congestion.  Shortness of breath.  A fever.  Body aches.  Chills.  A sore throat.  Follow these instructions at home: Medicines  Take over-the-counter and prescription medicines only as told by your doctor.  If you were prescribed an antibiotic medicine, take it as told by your doctor. Do not stop taking the antibiotic even if you start to feel better. General instructions  Rest.  Drink enough fluids to keep your pee (urine) clear or pale yellow.  Avoid smoking and secondhand smoke. If you smoke and you need help quitting, ask your doctor. Quitting will help your lungs heal faster.  Use an inhaler, cool mist vaporizer, or humidifier as told by your doctor.  Keep all follow-up visits as told by your doctor. This is important. How is this prevented? To lower your risk of getting this condition again:  Wash your hands often with soap and water. If you cannot use soap and water, use hand sanitizer.  Avoid contact with people who have cold symptoms.  Try not to touch your hands to your mouth, nose, or eyes.  Make sure to get the flu shot every year.  Contact a doctor if:  Your symptoms do not get better in 2 weeks. Get help right away if:  You cough up blood.  You have chest pain.  You have very bad shortness of breath.  You become dehydrated.  You faint  (pass out) or keep feeling like you are going to pass out.  You keep throwing up (vomiting).  You have a very bad headache.  Your fever or chills gets worse. This information is not intended to replace advice given to you by your health care provider. Make sure you discuss any questions you have with your health care provider. Document Released: 04/26/2008 Document Revised: 06/16/2016 Document Reviewed: 04/28/2016 Elsevier Interactive Patient Education  2018 [employer]. Earwax Buildup, Adult The ears produce a substance called earwax that helps keep bacteria out of the ear and protects the skin in the ear canal. Occasionally, earwax can build up in the ear and cause discomfort or hearing loss. What increases the risk? This condition is more likely to develop in people who:  Are female.  Are elderly.  Naturally produce more earwax.  Clean their ears often with cotton swabs.  Use earplugs often.  Use in-ear headphones often.  Wear hearing aids.  Have narrow ear canals.  Have earwax that is overly thick or sticky.  Have eczema.  Are dehydrated.  Have excess hair in the ear canal.  What are the signs or symptoms? Symptoms of this condition include:  Reduced or muffled hearing.  A feeling of fullness in the ear or feeling that the ear is plugged.  Fluid coming from the ear.  Ear pain.  Ear itch.  Ringing in the ear.  Coughing.  An obvious piece  of earwax that can be seen inside the ear canal.  How is this diagnosed? This condition may be diagnosed based on:  Your symptoms.  Your medical history.  An ear exam. During the exam, your health care provider will look into your ear with an instrument called an otoscope.  You may have tests, including a hearing test. How is this treated? This condition may be treated by:  Using ear drops to soften the earwax.  Having the earwax removed by a health care provider. The health care provider may: ? Flush the  ear with water. ? Use an instrument that has a loop on the end (curette). ? Use a suction device.  Surgery to remove the wax buildup. This may be done in severe cases.  Follow these instructions at home:  Take over-the-counter and prescription medicines only as told by your health care provider.  Do not put any objects, including cotton swabs, into your ear. You can clean the opening of your ear canal with a washcloth or facial tissue.  Follow instructions from your health care provider about cleaning your ears. Do not over-clean your ears.  Drink enough fluid to keep your urine clear or pale yellow. This will help to thin the earwax.  Keep all follow-up visits as told by your health care provider. If earwax builds up in your ears often or if you use hearing aids, consider seeing your health care provider for routine, preventive ear cleanings. Ask your health care provider how often you should schedule your cleanings.  If you have hearing aids, clean them according to instructions from the manufacturer and your health care provider. Contact a health care provider if:  You have ear pain.  You develop a fever.  You have blood, pus, or other fluid coming from your ear.  You have hearing loss.  You have ringing in your ears that does not go away.  Your symptoms do not improve with treatment.  You feel like the room is spinning (vertigo). Summary  Earwax can build up in the ear and cause discomfort or hearing loss.  The most common symptoms of this condition include reduced or muffled hearing and a feeling of fullness in the ear or feeling that the ear is plugged.  This condition may be diagnosed based on your symptoms, your medical history, and an ear exam.  This condition may be treated by using ear drops to soften the earwax or by having the earwax removed by a health care provider.  Do not put any objects, including cotton swabs, into your ear. You can clean the opening of  your ear canal with a washcloth or facial tissue. This information is not intended to replace advice given to you by your health care provider. Make sure you discuss any questions you have with your health care provider. Document Released: 12/16/2004 Document Revised: 01/19/2017 Document Reviewed: 01/19/2017 Elsevier Interactive Patient Education  [employer].

## 2018-01-02 ENCOUNTER — Encounter: Payer: Self-pay | Admitting: Internal Medicine

## 2018-01-02 ENCOUNTER — Ambulatory Visit (INDEPENDENT_AMBULATORY_CARE_PROVIDER_SITE_OTHER): Payer: No Typology Code available for payment source | Admitting: Internal Medicine

## 2018-01-02 VITALS — BP 136/80 | HR 76 | Temp 98.7°F | Ht 66.0 in | Wt 256.8 lb

## 2018-01-02 DIAGNOSIS — Z0000 Encounter for general adult medical examination without abnormal findings: Secondary | ICD-10-CM

## 2018-01-02 DIAGNOSIS — Z1329 Encounter for screening for other suspected endocrine disorder: Secondary | ICD-10-CM | POA: Diagnosis not present

## 2018-01-02 DIAGNOSIS — Z113 Encounter for screening for infections with a predominantly sexual mode of transmission: Secondary | ICD-10-CM | POA: Diagnosis not present

## 2018-01-02 DIAGNOSIS — I5022 Chronic systolic (congestive) heart failure: Secondary | ICD-10-CM | POA: Diagnosis not present

## 2018-01-02 DIAGNOSIS — L304 Erythema intertrigo: Secondary | ICD-10-CM | POA: Diagnosis not present

## 2018-01-02 MED ORDER — LISINOPRIL 40 MG PO TABS: 40.0000 mg | ORAL_TABLET | Freq: Every day | ORAL | 0 refills | Status: DC

## 2018-01-02 MED ORDER — CARVEDILOL 3.125 MG PO TABS: 3.1250 mg | ORAL_TABLET | Freq: Two times a day (BID) | ORAL | 0 refills | Status: DC

## 2018-01-02 MED ORDER — LISINOPRIL 40 MG PO TABS
40.0000 mg | ORAL_TABLET | Freq: Every day | ORAL | 0 refills | Status: DC
Start: 2018-01-02 — End: 2018-04-25

## 2018-01-02 MED ORDER — HYDROCORTISONE 1 % EX CREA: 1.0000 "application " | TOPICAL_CREAM | Freq: Two times a day (BID) | CUTANEOUS | 0 refills | Status: DC

## 2018-01-02 MED ORDER — TALC EX POWD: CUTANEOUS | 0 refills | Status: DC | PRN

## 2018-01-02 MED ORDER — FUROSEMIDE 40 MG PO TABS
ORAL_TABLET | ORAL | 6 refills | Status: DC
Start: 2018-01-02 — End: 2018-04-25

## 2018-01-02 MED ORDER — CLOTRIMAZOLE 1 % EX CREA: 1.0000 "application " | TOPICAL_CREAM | Freq: Two times a day (BID) | CUTANEOUS | 0 refills | Status: DC

## 2018-01-02 MED ORDER — SPIRONOLACTONE 25 MG PO TABS: ORAL_TABLET | ORAL | 0 refills | Status: DC

## 2018-01-02 NOTE — Progress Notes (Signed)
Chief Complaint  Patient presents with  . Establish Care   Establish care  1. C/o h/o systolic HF not f/u cardiology since 2013 She has been on these meds since 2013 they werent able to figure out why she had CHF but it was right after childbirth On coreg 3.125 bid, lisinopril 40 mg qd, spironolactone 25 mg qd, she is taking OTC K and not Rx K, lasix 40  2. C/o rash to breast area worse around her cycle. Nothing tried. She does report she sweats in this area  3. Obesity and wants to lose wt no current exercise she reports she likes fried chicken and rice. Lowest wt she has been in 220  LMP 12/06/17      Review of Systems  Constitutional: Negative for weight loss.  HENT: Negative for hearing loss.   Eyes:       No vision changes   Respiratory: Negative for shortness of breath.   Cardiovascular: Negative for chest pain and leg swelling.  Genitourinary:       No vaginal discharge   Musculoskeletal: Negative for falls.  Skin: Positive for rash.  Neurological: Negative for headaches.  Psychiatric/Behavioral: Negative for memory loss.   Past Medical History:  Diagnosis Date  . Chronic systolic heart failure ([place]) 03/14/2012   Echo 03/14/12: Mild LVH, EF 26-20%, grade 2 diastolic dysfunction, mild LAE.  She saw Dr. Percival Spanish on 04/16/12.  Marland Kitchen Hypertension   . Maternal anemia complicating pregnancy, childbirth, or the puerperium 09/22/2011  . NICM (nonischemic cardiomyopathy) [facility])    Past Surgical History:  Procedure Laterality Date  . CESAREAN SECTION  09/21/2011   Procedure: CESAREAN SECTION;  Surgeon: Agnes Lawrence, MD;  Location: [facility];  Service: Gynecology;  Laterality: N/A;  . FINGER SURGERY     Family History  Problem Relation Age of Onset  . Hypertension Mother   . Diabetes type II Mother   . Hypertension Father   . Hyperlipidemia Father   . Depression Maternal Grandmother   . Stroke Paternal Grandmother   . Anesthesia problems Neg Hx   . Hypotension Neg Hx   .  Malignant hyperthermia Neg Hx   . Pseudochol deficiency Neg Hx   . Heart attack Neg Hx    Social History   Socioeconomic History  . Marital status: Single    Spouse name: Not on file  . Number of children: 1  . Years of education: Not on file  . Highest education level: Not on file  Social Needs  . Financial resource strain: Not on file  . Food insecurity - worry: Not on file  . Food insecurity - inability: Not on file  . Transportation needs - medical: Not on file  . Transportation needs - non-medical: Not on file  Occupational History  . Not on file  Tobacco Use  . Smoking status: Never Smoker  . Smokeless tobacco: Never Used  Substance and Sexual Activity  . Alcohol use: No  . Drug use: No  . Sexual activity: Yes  Other Topics Concern  . Not on file  Social History Narrative   Lives in [place], [place] with boyfriend.    1 son 20 y.o    Current Meds  Medication Sig  . carvedilol (COREG) 3.125 MG tablet Take 1 tablet (3.125 mg total) by mouth 2 (two) times daily with a meal.  . diphenhydrAMINE (BENADRYL) 25 MG tablet Take 25 mg by mouth every 6 (six) hours as needed. For allergies  . furosemide (LASIX)  40 MG tablet TAKE ONE TABLET BY MOUTH ONCE DAILY in AM  . lisinopril (PRINIVIL,ZESTRIL) 40 MG tablet Take 1 tablet (40 mg total) by mouth daily.  Marland Kitchen spironolactone (ALDACTONE) 25 MG tablet TAKE ONE TABLET BY MOUTH ONCE DAILY  . [DISCONTINUED] carvedilol (COREG) 3.125 MG tablet Take 1 tablet (3.125 mg total) by mouth 2 (two) times daily with a meal.  . [DISCONTINUED] furosemide (LASIX) 40 MG tablet TAKE ONE TABLET BY MOUTH ONCE DAILY  . [DISCONTINUED] lisinopril (PRINIVIL,ZESTRIL) 20 MG tablet TAKE TWO TABLETS BY MOUTH ONCE DAILY  . [DISCONTINUED] lisinopril (PRINIVIL,ZESTRIL) 40 MG tablet Take 1 tablet (40 mg total) by mouth daily. TAKE TWO TABLETS BY MOUTH ONCE DAILY  . [DISCONTINUED] spironolactone (ALDACTONE) 25 MG tablet TAKE ONE TABLET BY MOUTH ONCE DAILY   Allergies   Allergen Reactions  . Darvocet [Propoxyphene N-Acetaminophen] Nausea And Vomiting   No results found for this or any previous visit (from the past 2160 hour(s)). Objective  Body mass index is 41.45 kg/m. Wt Readings from Last 3 Encounters:  01/02/18 256 lb 12.8 oz (116.5 kg)  11/30/17 250 lb (113.4 kg)  08/14/14 247 lb (112 kg)   Temp Readings from Last 3 Encounters:  01/02/18 98.7 F (37.1 C) (Oral)  11/30/17 99.2 F (37.3 C)  03/15/12 97.9 F (36.6 C) (Oral)   BP Readings from Last 3 Encounters:  01/02/18 136/80  11/30/17 133/80  08/14/14 (!) 150/78   Pulse Readings from Last 3 Encounters:  01/02/18 76  11/30/17 87  08/14/14 79   O2 sat room air 96%   Physical Exam  Constitutional: She is oriented to person, place, and time and well-developed, well-nourished, and in no distress. Vital signs are normal.  HENT:  Head: Normocephalic and atraumatic.  Mouth/Throat: Oropharynx is clear and moist and mucous membranes are normal.  Eyes: Conjunctivae are normal. Pupils are equal, round, and reactive to light.  Cardiovascular: Normal rate, regular rhythm and normal heart sounds.  Pulmonary/Chest: Effort normal and breath sounds normal.  Abdominal: Soft. Bowel sounds are normal. There is no tenderness.  Neurological: She is alert and oriented to person, place, and time. Gait normal. Gait normal.  Skin: Skin is warm, dry and intact.  Psychiatric: Mood, memory, affect and judgment normal.  Nursing note and vitals reviewed.   Assessment   1. Chronic systolic cHF, h/o HTN 2. Intertrigo  3. Obesity  4. HM Plan  1.  Refilled meds today lis 40 ,spironolactone 25, lasix 40 mg  Check labs fasting in am  Refer to cards Dr. Rockey Situ   2. Clotrimazole prn with HC, prn Zeasorb to keep dry 3. Disc diet and exercise to lose  4. Had flu shot 09/2017, Tdap 09/2017  Check hep B status  Check STDS,  Pap at f/u  Had polio vaccines, MMR, Hib  Provider: Dr. Olivia Mackie  [facility]

## 2018-01-02 NOTE — Progress Notes (Signed)
Pre visit review using our clinic review tool, if applicable. No additional management support is needed unless otherwise documented below in the visit note. 

## 2018-01-02 NOTE — Patient Instructions (Addendum)
Please f/u in 3 weeks pap sch fasting labs tomorrow  Take care  I referred you to Dr. Mariah Milling heart doctor  Please read below   Heart Failure Heart failure is a condition in which the heart has trouble pumping blood because it has become weak or stiff. This means that the heart does not pump blood efficiently for the body to work well. For some people with heart failure, fluid may back up into the lungs and there may be swelling (edema) in the lower legs. Heart failure is usually a long-term (chronic) condition. It is important for you to take good care of yourself and follow the treatment plan from your health care provider. What are the causes? This condition is caused by some health problems, including:  High blood pressure (hypertension). Hypertension causes the heart muscle to work harder than normal. High blood pressure eventually causes the heart to become stiff and weak.  Coronary artery disease (CAD). CAD is the buildup of cholesterol and fat (plaques) in the arteries of the heart.  Heart attack (myocardial infarction). Injured tissue, which is caused by the heart attack, does not contract as well and the heart's ability to pump blood is weakened.  Abnormal heart valves. When the heart valves do not open and close properly, the heart muscle must pump harder to keep the blood flowing.  Heart muscle disease (cardiomyopathy or myocarditis). Heart muscle disease is damage to the heart muscle from a variety of causes, such as drug or alcohol abuse, infections, or unknown causes. These can increase the risk of heart failure.  Lung disease. When the lungs do not work properly, the heart must work harder.  What increases the risk? Risk of heart failure increases as a person ages. This condition is also more likely to develop in people who:  Are overweight.  Are female.  Smoke or chew tobacco.  Abuse alcohol or illegal drugs.  Have taken medicines that can damage the heart, such as  chemotherapy drugs.  Have diabetes. ? High blood sugar (glucose) is associated with high fat (lipid) levels in the blood. ? Diabetes can also damage tiny blood vessels that carry nutrients to the heart muscle.  Have abnormal heart rhythms.  Have thyroid problems.  Have low blood counts (anemia).  What are the signs or symptoms? Symptoms of this condition include:  Shortness of breath with activity, such as when climbing stairs.  Persistent cough.  Swelling of the feet, ankles, legs, or abdomen.  Unexplained weight gain.  Difficulty breathing when lying flat (orthopnea).  Waking from sleep because of the need to sit up and get more air.  Rapid heartbeat.  Fatigue and loss of energy.  Feeling light-headed, dizzy, or close to fainting.  Loss of appetite.  Nausea.  Increased urination during the night (nocturia).  Confusion.  How is this diagnosed? This condition is diagnosed based on:  Medical history, symptoms, and a physical exam.  Diagnostic tests, which may include: ? Echocardiogram. ? Electrocardiogram (ECG). ? Chest X-ray. ? Blood tests. ? Exercise stress test. ? Radionuclide scans. ? Cardiac catheterization and angiogram.  How is this treated? Treatment for this condition is aimed at managing the symptoms of heart failure. Medicines, behavioral changes, or other treatments may be necessary to treat heart failure. Medicines These may include:  Angiotensin-converting enzyme (ACE) inhibitors. This type of medicine blocks the effects of a blood protein called angiotensin-converting enzyme. ACE inhibitors relax (dilate) the blood vessels and help to lower blood pressure.  Angiotensin receptor  blockers (ARBs). This type of medicine blocks the actions of a blood protein called angiotensin. ARBs dilate the blood vessels and help to lower blood pressure.  Water pills (diuretics). Diuretics cause the kidneys to remove salt and water from the blood. The extra  fluid is removed through urination, leaving a lower volume of blood that the heart has to pump.  Beta blockers. These improve heart muscle strength and they prevent the heart from beating too quickly.  Digoxin. This increases the force of the heartbeat.  Healthy behavior changes These may include:  Reaching and maintaining a healthy weight.  Stopping smoking or chewing tobacco.  Eating heart-healthy foods.  Limiting or avoiding alcohol.  Stopping use of street drugs (illegal drugs).  Physical activity.  Other treatments These may include:  Surgery to open blocked coronary arteries or repair damaged heart valves.  Placement of a biventricular pacemaker to improve heart muscle function (cardiac resynchronization therapy). This device paces both the right ventricle and left ventricle.  Placement of a device to treat serious abnormal heart rhythms (implantable cardioverter defibrillator, or ICD).  Placement of a device to improve the pumping ability of the heart (left ventricular assist device, or LVAD).  Heart transplant. This can cure heart failure, and it is considered for certain patients who do not improve with other therapies.  Follow these instructions at home: Medicines  Take over-the-counter and prescription medicines only as told by your health care provider. Medicines are important in reducing the workload of your heart, slowing the progression of heart failure, and improving your symptoms. ? Do not stop taking your medicine unless your health care provider told you to do that. ? Do not skip any dose of medicine. ? Refill your prescriptions before you run out of medicine. You need your medicines every day. Eating and drinking   Eat heart-healthy foods. Talk with a dietitian to make an eating plan that is right for you. ? Choose foods that contain no trans fat and are low in saturated fat and cholesterol. Healthy choices include fresh or frozen fruits and  vegetables, fish, lean meats, legumes, fat-free or low-fat dairy products, and whole-grain or high-fiber foods. ? Limit salt (sodium) if directed by your health care provider. Sodium restriction may reduce symptoms of heart failure. Ask a dietitian to recommend heart-healthy seasonings. ? Use healthy cooking methods instead of frying. Healthy methods include roasting, grilling, broiling, baking, poaching, steaming, and stir-frying.  Limit your fluid intake if directed by your health care provider. Fluid restriction may reduce symptoms of heart failure. Lifestyle  Stop smoking or using chewing tobacco. Nicotine and tobacco can damage your heart and your blood vessels. Do not use nicotine gum or patches before talking to your health care provider.  Limit alcohol intake to no more than 1 drink per day for non-pregnant women and 2 drinks per day for men. One drink equals 12 oz of beer, 5 oz of wine, or 1 oz of hard liquor. ? Drinking more than that is harmful to your heart. Tell your health care provider if you drink alcohol several times a week. ? Talk with your health care provider about whether any level of alcohol use is safe for you. ? If your heart has already been damaged by alcohol or you have severe heart failure, drinking alcohol should be stopped completely.  Stop use of illegal drugs.  Lose weight if directed by your health care provider. Weight loss may reduce symptoms of heart failure.  Do moderate physical activity  if directed by your health care provider. People who are elderly and people with severe heart failure should consult with a health care provider for physical activity recommendations. Monitor important information  Weigh yourself every day. Keeping track of your weight daily helps you to notice excess fluid sooner. ? Weigh yourself every morning after you urinate and before you eat breakfast. ? Wear the same amount of clothing each time you weigh yourself. ? Record your  daily weight. Provide your health care provider with your weight record.  Monitor and record your blood pressure as told by your health care provider.  Check your pulse as told by your health care provider. Dealing with extreme temperatures  If the weather is extremely hot: ? Avoid vigorous physical activity. ? Use air conditioning or fans or seek a cooler location. ? Avoid caffeine and alcohol. ? Wear loose-fitting, lightweight, and light-colored clothing.  If the weather is extremely cold: ? Avoid vigorous physical activity. ? Layer your clothes. ? Wear mittens or gloves, a hat, and a scarf when you go outside. ? Avoid alcohol. General instructions  Manage other health conditions such as hypertension, diabetes, thyroid disease, or abnormal heart rhythms as told by your health care provider.  Learn to manage stress. If you need help to do this, ask your health care provider.  Plan rest periods when fatigued.  Get ongoing education and support as needed.  Participate in or seek rehabilitation as needed to maintain or improve independence and quality of life.  Stay up to date with immunizations. Keeping current on pneumococcal and influenza immunizations is especially important to prevent respiratory infections.  Keep all follow-up visits as told by your health care provider. This is important. Contact a health care provider if:  You have a rapid weight gain.  You have increasing shortness of breath that is unusual for you.  You are unable to participate in your usual physical activities.  You tire easily.  You cough more than normal, especially with physical activity.  You have any swelling or more swelling in areas such as your hands, feet, ankles, or abdomen.  You are unable to sleep because it is hard to breathe.  You feel like your heart is beating quickly (palpitations).  You become dizzy or light-headed when you stand up. Get help right away if:  You have  difficulty breathing.  You notice or your family notices a change in your awareness, such as having trouble staying awake or having difficulty with concentration.  You have pain or discomfort in your chest.  You have an episode of fainting (syncope). This information is not intended to replace advice given to you by your health care provider. Make sure you discuss any questions you have with your health care provider. Document Released: 11/08/2005 Document Revised: 07/13/2016 Document Reviewed: 06/02/2016 Elsevier Interactive Patient Education  2018 [employer].  Celesta Aver Intertrigo is skin irritation or inflammation (dermatitis) that occurs when folds of skin rub together. The irritation can cause a rash and make skin raw and itchy. This condition most commonly occurs in the skin folds of these areas:  Toes.  Armpits.  Groin.  Belly.  Breasts.  Buttocks.  Intertrigo is not passed from person to person (is not contagious). What are the causes? This condition is caused by heat, moisture, friction, and lack of air circulation. The condition can be made worse by:  Sweat.  Bacteria or a fungus, such as yeast.  What increases the risk? This condition is more likely  to occur if you have moisture in your skin folds. It is also more likely to develop in people who:  Have diabetes.  Are overweight.  Are on bed rest.  Live in a warm and moist climate.  Wear splints, braces, or other medical devices.  Are not able to control their bowels or bladder (have incontinence).  What are the signs or symptoms? Symptoms of this condition include:  A pink or red skin rash.  Brown patches on the skin.  Raw or scaly skin.  Itchiness.  A burning feeling.  Bleeding.  Leaking fluid.  A bad smell.  How is this diagnosed? This condition is diagnosed with a medical history and physical exam. You may also have a skin swab to test for bacteria or a fungus, such as yeast. How  is this treated? Treatment may include:  Cleaning and drying your skin.  An oral antibiotic medicine or antibiotic skin cream for a bacterial infection.  Antifungal cream or pills for an infection that was caused by a fungus, such as yeast.  Steroid ointment to relieve itchiness and irritation.  Follow these instructions at home:  Keep the affected area clean and dry.  Do not scratch your skin.  Stay in a cool environment as much as possible. Use an air conditioner or fan, if available.  Apply over-the-counter and prescription medicines only as told by your health care provider.  If you were prescribed an antibiotic medicine, use it as told by your health care provider. Do not stop using the antibiotic even if your condition improves.  Keep all follow-up visits as told by your health care provider. This is important. How is this prevented?  Maintain a healthy weight.  Take care of your feet, especially if you have diabetes. Foot care includes: ? Wearing shoes that fit well. ? Keeping your feet dry. ? Wearing clean, breathable socks.  Protect the skin around your groin and buttocks, especially if you have incontinence. Skin protection includes: ? Following a regular cleaning routine. ? Using moisturizers and skin protectants. ? Changing protection pads frequently.  Do not wear tight clothes. Wear clothes that are loose and absorbent. Wear clothes that are made of cotton.  Wear a bra that gives good support, if needed.  Shower and dry yourself thoroughly after activity. Use a hair dryer on a cool setting to dry between skin folds, especially after you bathe.  If you have diabetes, keep your blood sugar under control. Contact a health care provider if:  Your symptoms do not improve with treatment.  Your symptoms get worse or they spread.  You notice increased redness and warmth.  You have a fever. This information is not intended to replace advice given to you by  your health care provider. Make sure you discuss any questions you have with your health care provider. Document Released: 11/08/2005 Document Revised: 04/15/2016 Document Reviewed: 05/12/2015 Elsevier Interactive Patient Education  2018 [employer].

## 2018-01-03 ENCOUNTER — Other Ambulatory Visit (HOSPITAL_COMMUNITY)
Admission: RE | Admit: 2018-01-03 | Discharge: 2018-01-03 | Disposition: A | Discharge status: Home / Self Care | Payer: No Typology Code available for payment source | Source: Ambulatory Visit | Attending: Internal Medicine | Admitting: Internal Medicine

## 2018-01-03 ENCOUNTER — Other Ambulatory Visit (INDEPENDENT_AMBULATORY_CARE_PROVIDER_SITE_OTHER): Payer: No Typology Code available for payment source

## 2018-01-03 DIAGNOSIS — I5022 Chronic systolic (congestive) heart failure: Secondary | ICD-10-CM

## 2018-01-03 DIAGNOSIS — Z0000 Encounter for general adult medical examination without abnormal findings: Secondary | ICD-10-CM | POA: Diagnosis not present

## 2018-01-03 DIAGNOSIS — Z113 Encounter for screening for infections with a predominantly sexual mode of transmission: Secondary | ICD-10-CM | POA: Diagnosis not present

## 2018-01-03 DIAGNOSIS — Z1329 Encounter for screening for other suspected endocrine disorder: Secondary | ICD-10-CM | POA: Diagnosis not present

## 2018-01-03 LAB — LIPID PANEL
CHOLESTEROL: 201 mg/dL — AB (ref 0–200)
HDL: 36.8 mg/dL — ABNORMAL LOW (ref >39.00)
LDL Cholesterol: 147 mg/dL — ABNORMAL HIGH (ref 0–99)
NonHDL: 164.12
TRIGLYCERIDES: 85 mg/dL (ref 0.0–149.0)
Total CHOL/HDL Ratio: 5
VLDL: 17 mg/dL (ref 0.0–40.0)

## 2018-01-03 LAB — COMPREHENSIVE METABOLIC PANEL
ALT: 9 U/L (ref 0–35)
AST: 10 U/L (ref 0–37)
Albumin: 3.9 g/dL (ref 3.5–5.2)
Alkaline Phosphatase: 53 U/L (ref 39–117)
BUN: 12 mg/dL (ref 6–23)
CALCIUM: 9.1 mg/dL (ref 8.4–10.5)
CHLORIDE: 103 meq/L (ref 96–112)
CO2: 27 meq/L (ref 19–32)
CREATININE: 0.77 mg/dL (ref 0.40–1.20)
GFR: 112.27 mL/min (ref >60.00)
Glucose, Bld: 98 mg/dL (ref 70–99)
POTASSIUM: 4.3 meq/L (ref 3.5–5.1)
Sodium: 137 mEq/L (ref 135–145)
Total Bilirubin: 0.9 mg/dL (ref 0.2–1.2)
Total Protein: 6.7 g/dL (ref 6.0–8.3)

## 2018-01-03 LAB — URINALYSIS, ROUTINE W REFLEX MICROSCOPIC
Bilirubin Urine: NEGATIVE
Hgb urine dipstick: NEGATIVE
Ketones, ur: NEGATIVE
Nitrite: NEGATIVE
SPECIFIC GRAVITY, URINE: 1.02 (ref 1.000–1.030)
Total Protein, Urine: NEGATIVE
Urine Glucose: NEGATIVE
Urobilinogen, UA: 0.2 (ref 0.0–1.0)
pH: 6.5 (ref 5.0–8.0)

## 2018-01-03 LAB — CBC WITH DIFFERENTIAL/PLATELET
BASOS PCT: 0.5 % (ref 0.0–3.0)
Basophils Absolute: 0 10*3/uL (ref 0.0–0.1)
EOS ABS: 0.2 10*3/uL (ref 0.0–0.7)
Eosinophils Relative: 2.7 % (ref 0.0–5.0)
HEMATOCRIT: 38.8 % (ref 36.0–46.0)
Hemoglobin: 12.6 g/dL (ref 12.0–15.0)
LYMPHS PCT: 35.6 % (ref 12.0–46.0)
Lymphs Abs: 2.7 10*3/uL (ref 0.7–4.0)
MCHC: 32.6 g/dL (ref 30.0–36.0)
MCV: 81 fl (ref 78.0–100.0)
MONO ABS: 0.6 10*3/uL (ref 0.1–1.0)
Monocytes Relative: 8.3 % (ref 3.0–12.0)
NEUTROS ABS: 4 10*3/uL (ref 1.4–7.7)
Neutrophils Relative %: 52.9 % (ref 43.0–77.0)
PLATELETS: 364 10*3/uL (ref 150.0–400.0)
RBC: 4.79 Mil/uL (ref 3.87–5.11)
RDW: 14 % (ref 11.5–15.5)
WBC: 7.6 10*3/uL (ref 4.0–10.5)

## 2018-01-03 LAB — TSH: TSH: 1.1 u[IU]/mL (ref 0.35–4.50)

## 2018-01-03 LAB — T4, FREE: FREE T4: 0.81 ng/dL (ref 0.60–1.60)

## 2018-01-03 LAB — MAGNESIUM: Magnesium: 2 mg/dL (ref 1.5–2.5)

## 2018-01-04 ENCOUNTER — Ambulatory Visit: Payer: Self-pay | Admitting: Physician Assistant

## 2018-01-04 LAB — HIV ANTIBODY (ROUTINE TESTING W REFLEX): HIV 1&2 Ab, 4th Generation: NONREACTIVE

## 2018-01-04 LAB — URINE CYTOLOGY ANCILLARY ONLY
Chlamydia: NEGATIVE
Neisseria Gonorrhea: NEGATIVE
TRICH (WINDOWPATH): NEGATIVE

## 2018-01-04 LAB — HEPATITIS C ANTIBODY
HEP C AB: NONREACTIVE
SIGNAL TO CUT-OFF: 0.01 (ref <1.00)

## 2018-01-04 LAB — RPR: RPR: NONREACTIVE

## 2018-01-04 LAB — HSV 1 ANTIBODY, IGG: HSV 1 GLYCOPROTEIN G AB, IGG: 26.4 {index} — AB

## 2018-01-04 LAB — HEPATITIS B SURFACE ANTIGEN: Hepatitis B Surface Ag: NONREACTIVE

## 2018-01-04 LAB — HSV 2 ANTIBODY, IGG: HSV 2 Glycoprotein G Ab, IgG: <0.9 index

## 2018-01-04 LAB — HEPATITIS B SURFACE ANTIBODY, QUANTITATIVE

## 2018-01-06 ENCOUNTER — Other Ambulatory Visit: Payer: Self-pay | Admitting: Internal Medicine

## 2018-01-06 DIAGNOSIS — B9689 Other specified bacterial agents as the cause of diseases classified elsewhere: Secondary | ICD-10-CM

## 2018-01-06 DIAGNOSIS — N760 Acute vaginitis: Principal | ICD-10-CM

## 2018-01-06 LAB — URINE CYTOLOGY ANCILLARY ONLY

## 2018-01-06 MED ORDER — METRONIDAZOLE 500 MG PO TABS: 500.0000 mg | ORAL_TABLET | Freq: Two times a day (BID) | ORAL | 0 refills | Status: DC

## 2018-01-23 ENCOUNTER — Ambulatory Visit: Payer: No Typology Code available for payment source | Admitting: Internal Medicine

## 2018-02-04 NOTE — Progress Notes (Deleted)
Cardiology Office Note  Date:  02/04/2018   ID:  Patricia Reynolds, DOB May 24, 1986, MRN [id number]  PCP:  [facility], Pasty Spillers, MD   No chief complaint on file.   HPI:  [name] is a 32 yo woman with PMD of  HTN C/o h/o systolic HF , last EF 45 to 50%, up from 25% not f/u cardiology since 2013  cardiomyopathy right after childbirth   On coreg 3.125 bid, lisinopril 40 mg qd, spironolactone 25 mg qd, she is taking OTC K and not Rx K, lasix 40   PMH:   has a past medical history of Bronchitis, Chicken pox, Chronic systolic heart failure (HCC) (02/28/8118), Hypertension, Maternal anemia complicating pregnancy, childbirth, or the puerperium (09/22/2011), and NICM (nonischemic cardiomyopathy) ([facility]).  PSH:    Past Surgical History:  Procedure Laterality Date  . CESAREAN SECTION  09/21/2011   Procedure: CESAREAN SECTION;  Surgeon: Roseanna Rainbow, MD;  Location: [facility];  Service: Gynecology;  Laterality: N/A;  . FINGER SURGERY     right thumb repair 2006 playing Basketball     Current Outpatient Medications  Medication Sig Dispense Refill  . carvedilol (COREG) 3.125 MG tablet Take 1 tablet (3.125 mg total) by mouth 2 (two) times daily with a meal. 180 tablet 0  . clotrimazole (LOTRIMIN) 1 % cream Apply 1 application topically 2 (two) times daily. 30 g 0  . diphenhydrAMINE (BENADRYL) 25 MG tablet Take 25 mg by mouth every 6 (six) hours as needed. For allergies    . furosemide (LASIX) 40 MG tablet TAKE ONE TABLET BY MOUTH ONCE DAILY in AM 90 tablet 6  . hydrocortisone cream 1 % Apply 1 application topically 2 (two) times daily. 30 g 0  . lisinopril (PRINIVIL,ZESTRIL) 40 MG tablet Take 1 tablet (40 mg total) by mouth daily. 90 tablet 0  . metroNIDAZOLE (FLAGYL) 500 MG tablet Take 1 tablet (500 mg total) by mouth 2 (two) times daily. With food 14 tablet 0  . spironolactone (ALDACTONE) 25 MG tablet TAKE ONE TABLET BY MOUTH ONCE DAILY 90 tablet 0  . talc powder Apply  topically as needed. 240 g 0   No current facility-administered medications for this visit.      Allergies:   Darvocet [propoxyphene n-acetaminophen]   Social History:  The patient  reports that  has never smoked. she has never used smokeless tobacco. She reports that she does not drink alcohol or use drugs.   Family History:   family history includes Depression in her maternal grandmother; Diabetes in her mother; Diabetes type II in her mother; Hyperlipidemia in her father; Hypertension in her father and mother; Stroke in her paternal grandmother.    Review of Systems: ROS   PHYSICAL EXAM: VS:  There were no vitals taken for this visit. , BMI There is no height or weight on file to calculate BMI. GEN: Well nourished, well developed, in no acute distress  HEENT: normal  Neck: no JVD, carotid bruits, or masses Cardiac: RRR; no murmurs, rubs, or gallops,no edema  Respiratory:  clear to auscultation bilaterally, normal work of breathing GI: soft, nontender, nondistended, + BS MS: no deformity or atrophy  Skin: warm and dry, no rash Neuro:  Strength and sensation are intact Psych: euthymic mood, full affect    Recent Labs: 01/03/2018: ALT 9; BUN 12; Creatinine, Ser 0.77; Hemoglobin 12.6; Magnesium 2.0; Platelets 364.0; Potassium 4.3; Sodium 137; TSH 1.10    Lipid Panel Lab Results  Component Value Date  CHOL 201 (H) 01/03/2018   HDL 36.80 (L) 01/03/2018   LDLCALC 147 (H) 01/03/2018   TRIG 85.0 01/03/2018      Wt Readings from Last 3 Encounters:  01/02/18 256 lb 12.8 oz (116.5 kg)  11/30/17 250 lb (113.4 kg)  08/14/14 247 lb (112 kg)       ASSESSMENT AND PLAN:  No diagnosis found.   Disposition:   F/U  6 months  No orders of the defined types were placed in this encounter.    Signed, Dossie Arbour, M.D., Ph.D. 02/04/2018  [facility] [place], [place] [phone]

## 2018-02-07 ENCOUNTER — Ambulatory Visit: Payer: No Typology Code available for payment source | Admitting: Cardiovascular Disease

## 2018-03-01 ENCOUNTER — Encounter: Payer: Self-pay | Admitting: Physician Assistant

## 2018-04-13 ENCOUNTER — Encounter: Payer: Self-pay | Admitting: Internal Medicine

## 2018-04-15 ENCOUNTER — Other Ambulatory Visit: Payer: Self-pay | Admitting: Internal Medicine

## 2018-04-15 DIAGNOSIS — L304 Erythema intertrigo: Secondary | ICD-10-CM

## 2018-04-15 MED ORDER — HYDROCORTISONE 2.5 % EX CREA: TOPICAL_CREAM | Freq: Two times a day (BID) | CUTANEOUS | 0 refills | Status: DC

## 2018-04-15 MED ORDER — FLUCONAZOLE 150 MG PO TABS: 150.0000 mg | ORAL_TABLET | Freq: Once | ORAL | 0 refills | Status: AC

## 2018-04-24 NOTE — Progress Notes (Signed)
Cardiology Office Note  Date:  04/25/2018   ID:  Patricia Reynolds, DOB 1986-11-15, MRN [id number]  PCP:  McLean-Scocuzza, Pasty Spillers, MD   Chief Complaint  Patient presents with  . New Patient (Initial Visit)    Chronic systolic heart failure Per Dr. Shirlee Latch. Patient states she feels fine.  Meds reviewed verbally with patient.     HPI:  Patricia Reynolds is a 32 year old woman with past medical history of Nonischemic cardiomyopathy Morbid Obesity hypertension systolic HF secondary to poorly controlled hypertension,  last f/u with cardiology 2013  Who presents by referral from Dr. French Ana McLean-Scocuzza for consultation of her dilated cardiopathy and chronic systolic CHF  She reports that she has been on the same medications this 2013 Reports that she feels well in general, denies any significant fluid retention Several months ago did develop some swelling in the back of her legs, hands Sometimes takes extra Lasix  Medication regimentAs below coreg 3.125 bid,  lisinopril 40 mg qd,  spironolactone 25 mg qd,  OTC K and not Rx K, lasix 40   Previous testing detailed below Echo 03/14/12: Mild LVH, EF 20-25%, grade 2 diastolic dysfunction, mild LAE.  She saw Dr. Antoine Poche on 04/16/12.  ejection fraction  improved from 25% April 2013 to 45% to 50%  October 2013  She has a six-year-old boy and indicated she would like to have another child but was warned against this given her cardiomyopathy  EKG personally reviewed by myself on todays visit Shows normal sinus rhythm rate 84 bpm no significant ST-T wave changes, rare PVC   PMH:   has a past medical history of Bronchitis, Chicken pox, Chronic systolic heart failure (HCC) (07/09/2992), Hypertension, Maternal anemia complicating pregnancy, childbirth, or the puerperium (09/22/2011), and NICM (nonischemic cardiomyopathy) (HCC).  PSH:    Past Surgical History:  Procedure Laterality Date  . CESAREAN SECTION  09/21/2011   Procedure:  CESAREAN SECTION;  Surgeon: Roseanna Rainbow, MD;  Location: [facility];  Service: Gynecology;  Laterality: N/A;  . FINGER SURGERY     right thumb repair 2006 playing Basketball     Current Outpatient Medications  Medication Sig Dispense Refill  . carvedilol (COREG) 3.125 MG tablet Take 1 tablet (3.125 mg total) by mouth 2 (two) times daily with a meal. 180 tablet 0  . furosemide (LASIX) 40 MG tablet TAKE ONE TABLET BY MOUTH ONCE DAILY in AM 90 tablet 6  . lisinopril (PRINIVIL,ZESTRIL) 40 MG tablet Take 1 tablet (40 mg total) by mouth daily. 90 tablet 0  . spironolactone (ALDACTONE) 25 MG tablet TAKE ONE TABLET BY MOUTH ONCE DAILY 90 tablet 0   No current facility-administered medications for this visit.      Allergies:   Darvocet [propoxyphene n-acetaminophen]   Social History:  The patient  reports that she has never smoked. She has never used smokeless tobacco. She reports that she does not drink alcohol or use drugs.   Family History:   family history includes Depression in her maternal grandmother; Diabetes in her mother; Diabetes type II in her mother; Hyperlipidemia in her father; Hypertension in her father and mother; Stroke in her paternal grandmother.    Review of Systems: Review of Systems  Constitutional: Negative.   Respiratory: Negative.   Cardiovascular: Negative.   Gastrointestinal: Negative.   Musculoskeletal: Negative.   Neurological: Negative.   Psychiatric/Behavioral: Negative.   All other systems reviewed and are negative.    PHYSICAL EXAM: VS:  BP 118/80 (BP Location: Right Arm,  Patient Position: Sitting, Cuff Size: Large)   Pulse 84   Ht 5\' 6"  (1.676 m)   Wt 249 lb (112.9 kg)   BMI 40.19 kg/m  , BMI Body mass index is 40.19 kg/m. GEN: Well nourished, well developed, in no acute distress , obese HEENT: normal  Neck: no JVD, carotid bruits, or masses Cardiac: RRR; no murmurs, rubs, or gallops,no edema  Respiratory:  clear to auscultation  bilaterally, normal work of breathing GI: soft, nontender, nondistended, + BS Patricia: no deformity or atrophy  Skin: warm and dry, no rash Neuro:  Strength and sensation are intact Psych: euthymic mood, full affect    Recent Labs: 01/03/2018: ALT 9; BUN 12; Creatinine, Ser 0.77; Hemoglobin 12.6; Magnesium 2.0; Platelets 364.0; Potassium 4.3; Sodium 137; TSH 1.10    Lipid Panel Lab Results  Component Value Date   CHOL 201 (H) 01/03/2018   HDL 36.80 (L) 01/03/2018   LDLCALC 147 (H) 01/03/2018   TRIG 85.0 01/03/2018      Wt Readings from Last 3 Encounters:  04/25/18 249 lb (112.9 kg)  01/02/18 256 lb 12.8 oz (116.5 kg)  11/30/17 250 lb (113.4 kg)       ASSESSMENT AND PLAN:  NICM (nonischemic cardiomyopathy) (HCC) - Plan: EKG 12-Lead Recommend we continue the current medications with exception of increasing the Coreg up to 6.25 mill grams twice a day Recommended extra Lasix after lunch for any leg swelling and hand swelling She denies ever having abdominal swelling or shortness of breath When she has fluid retention  Acute systolic congestive heart failure (HCC) - Plan: EKG 12-Lead Appears relatively euvolemic on today's visit Decreasing Coreg as detailed above Extra Lasix as needed for weight cane or leg swelling  Chronic systolic heart failure (HCC) Has remained at a hospital for many years on her current regimen We did offer repeat echocardiogram, she has declined at this time  Essential hypertension Medication changes as above Recommended last on modification, weight loss, dietary changes  Disposition:   F/U  12 months   Total encounter time more than 60 minutes  Greater than 50% was spent in counseling and coordination of care with the patient  Patient seen in consultation for Dr. French Ana McLean-Scocuzza nd will be referred back to her office from going care of the issues detailed above    Orders Placed This Encounter  Procedures  . EKG 12-Lead      Signed, Dossie Arbour, M.D., Ph.D. 04/25/2018  [facility] [place], [place] [phone]

## 2018-04-25 ENCOUNTER — Ambulatory Visit (INDEPENDENT_AMBULATORY_CARE_PROVIDER_SITE_OTHER): Payer: BC Managed Care – PPO | Admitting: Cardiovascular Disease

## 2018-04-25 VITALS — BP 118/80 | HR 84 | Ht 66.0 in | Wt 249.0 lb

## 2018-04-25 DIAGNOSIS — I10 Essential (primary) hypertension: Secondary | ICD-10-CM | POA: Diagnosis not present

## 2018-04-25 DIAGNOSIS — I5021 Acute systolic (congestive) heart failure: Secondary | ICD-10-CM

## 2018-04-25 DIAGNOSIS — I428 Other cardiomyopathies: Secondary | ICD-10-CM | POA: Diagnosis not present

## 2018-04-25 DIAGNOSIS — I5022 Chronic systolic (congestive) heart failure: Secondary | ICD-10-CM | POA: Diagnosis not present

## 2018-04-25 MED ORDER — SPIRONOLACTONE 25 MG PO TABS: ORAL_TABLET | ORAL | 3 refills | Status: DC

## 2018-04-25 MED ORDER — CARVEDILOL 6.25 MG PO TABS: 6.2500 mg | ORAL_TABLET | Freq: Two times a day (BID) | ORAL | 3 refills | Status: DC

## 2018-04-25 MED ORDER — LISINOPRIL 40 MG PO TABS: 40.0000 mg | ORAL_TABLET | Freq: Every day | ORAL | 3 refills | Status: DC

## 2018-04-25 MED ORDER — FUROSEMIDE 40 MG PO TABS
ORAL_TABLET | ORAL | 3 refills | Status: DC
Start: 2018-04-25 — End: 2018-07-11

## 2018-04-25 NOTE — Patient Instructions (Signed)
Medication Instructions:   Please increase the coreg up to 6.25 mg twice a day  Labwork:  No new labs needed  Testing/Procedures:  No further testing at this time   Follow-Up: It was a pleasure seeing you in the office today. Please call us if you have new issues that need to be addressed before your next appt.  [phone]  Your physician wants you to follow-up in: 12 months.  You will receive a reminder letter in the mail two months in advance. If you don't receive a letter, please call our office to schedule the follow-up appointment.  If you need a refill on your cardiac medications before your next appointment, please call your pharmacy.  For educational health videos Log in to : [email or web address] Or : [email or web address], password : [email or web address]

## 2018-07-11 ENCOUNTER — Other Ambulatory Visit: Payer: Self-pay | Admitting: *Deleted

## 2018-07-11 DIAGNOSIS — I5022 Chronic systolic (congestive) heart failure: Secondary | ICD-10-CM

## 2018-07-11 MED ORDER — FUROSEMIDE 40 MG PO TABS: ORAL_TABLET | ORAL | 3 refills | Status: DC

## 2018-07-11 MED ORDER — LISINOPRIL 40 MG PO TABS: 40.0000 mg | ORAL_TABLET | Freq: Every day | ORAL | 3 refills | Status: DC

## 2018-07-11 MED ORDER — SPIRONOLACTONE 25 MG PO TABS: ORAL_TABLET | ORAL | 3 refills | Status: DC

## 2018-07-11 MED ORDER — CARVEDILOL 6.25 MG PO TABS: 6.2500 mg | ORAL_TABLET | Freq: Two times a day (BID) | ORAL | 3 refills | Status: DC

## 2019-03-08 ENCOUNTER — Encounter: Payer: Self-pay | Admitting: Internal Medicine

## 2019-03-08 ENCOUNTER — Ambulatory Visit (INDEPENDENT_AMBULATORY_CARE_PROVIDER_SITE_OTHER): Payer: BC Managed Care – PPO | Admitting: Internal Medicine

## 2019-03-08 DIAGNOSIS — I10 Essential (primary) hypertension: Secondary | ICD-10-CM

## 2019-03-08 DIAGNOSIS — J9801 Acute bronchospasm: Secondary | ICD-10-CM | POA: Diagnosis not present

## 2019-03-08 DIAGNOSIS — T7840XA Allergy, unspecified, initial encounter: Secondary | ICD-10-CM

## 2019-03-08 DIAGNOSIS — E785 Hyperlipidemia, unspecified: Secondary | ICD-10-CM

## 2019-03-08 DIAGNOSIS — I428 Other cardiomyopathies: Secondary | ICD-10-CM | POA: Diagnosis not present

## 2019-03-08 DIAGNOSIS — Z1389 Encounter for screening for other disorder: Secondary | ICD-10-CM

## 2019-03-08 DIAGNOSIS — E559 Vitamin D deficiency, unspecified: Secondary | ICD-10-CM

## 2019-03-08 DIAGNOSIS — L304 Erythema intertrigo: Secondary | ICD-10-CM

## 2019-03-08 DIAGNOSIS — I5022 Chronic systolic (congestive) heart failure: Secondary | ICD-10-CM

## 2019-03-08 DIAGNOSIS — Z1329 Encounter for screening for other suspected endocrine disorder: Secondary | ICD-10-CM

## 2019-03-08 MED ORDER — OLOPATADINE HCL 0.1 % OP SOLN: 1.0000 [drp] | Freq: Two times a day (BID) | OPHTHALMIC | 12 refills | Status: DC

## 2019-03-08 MED ORDER — ALBUTEROL SULFATE HFA 108 (90 BASE) MCG/ACT IN AERS: 1.0000 | INHALATION_SPRAY | Freq: Four times a day (QID) | RESPIRATORY_TRACT | 12 refills | Status: DC | PRN

## 2019-03-08 MED ORDER — CLOTRIMAZOLE 1 % EX CREA: 1.0000 "application " | TOPICAL_CREAM | Freq: Two times a day (BID) | CUTANEOUS | 11 refills | Status: DC

## 2019-03-08 MED ORDER — CETIRIZINE HCL 10 MG PO TABS: 10.0000 mg | ORAL_TABLET | Freq: Every evening | ORAL | 3 refills | Status: AC | PRN

## 2019-03-08 MED ORDER — TALC EX POWD: CUTANEOUS | 12 refills | Status: DC | PRN

## 2019-03-08 NOTE — Patient Instructions (Signed)
Intertrigo Intertrigo is skin irritation or inflammation (dermatitis) that occurs when folds of skin rub together. The irritation can cause a rash and make skin raw and itchy. This condition most commonly occurs in the skin folds of these areas:  Toes.  Armpits.  Groin.  Under the belly.  Under the breasts.  Buttocks. Intertrigo is not passed from person to person (is not contagious). What are the causes? This condition is caused by heat, moisture, rubbing (friction), and not enough air circulation. The condition can be made worse by:  Sweat.  Bacteria.  A fungus, such as yeast. What increases the risk? This condition is more likely to occur if you have moisture in your skin folds. You are more likely to develop this condition if you:  Have diabetes.  Are overweight.  Are not able to move around or are not active.  Live in a warm and moist climate.  Wear splints, braces, or other medical devices.  Are not able to control your bowels or bladder (have incontinence). What are the signs or symptoms? Symptoms of this condition include:  A pink or red skin rash in the skin fold or near the skin fold.  Raw or scaly skin.  Itchiness.  A burning feeling.  Bleeding.  Leaking fluid.  A bad smell. How is this diagnosed? This condition is diagnosed with a medical history and physical exam. You may also have a skin swab to test for bacteria or a fungus. How is this treated? This condition may be treated by:  Cleaning and drying your skin.  Taking an antibiotic medicine or using an antibiotic skin cream for a bacterial infection.  Using an antifungal cream on your skin or taking pills for an infection that was caused by a fungus, such as yeast.  Using a steroid ointment to relieve itchiness and irritation.  Separating the skin fold with a clean cotton cloth to absorb moisture and allow air to flow into the area. Follow these instructions at home:  Keep the  affected area clean and dry.  Do not scratch your skin.  Stay in a cool environment as much as possible. Use an air conditioner or fan, if available.  Apply over-the-counter and prescription medicines only as told by your health care provider.  If you were prescribed an antibiotic medicine, use it as told by your health care provider. Do not stop using the antibiotic even if your condition improves.  Keep all follow-up visits as told by your health care provider. This is important. How is this prevented?   Maintain a healthy weight.  Take care of your feet, especially if you have diabetes. Foot care includes: ? Wearing shoes that fit well. ? Keeping your feet dry. ? Wearing clean, breathable socks.  Protect the skin around your groin and buttocks, especially if you have incontinence. Skin protection includes: ? Following a regular cleaning routine. ? Using skin protectant creams, powders, or ointments. ? Changing protection pads frequently.  Do not wear tight clothes. Wear clothes that are loose, absorbent, and made of cotton.  Wear a bra that gives good support, if needed.  Shower and dry yourself well after activity or exercise. Use a hair dryer on a cool setting to dry between skin folds, especially after you bathe.  If you have diabetes, keep your blood sugar under control. Contact a health care provider if:  Your symptoms do not improve with treatment.  Your symptoms get worse or they spread.  You notice increased redness and  warmth.  You have a fever. Summary  Intertrigo is skin irritation or inflammation (dermatitis) that occurs when folds of skin rub together.  This condition is caused by heat, moisture, rubbing (friction), and not enough air circulation.  This condition may be treated by cleaning and drying your skin and with medicines.  Apply over-the-counter and prescription medicines only as told by your health care provider.  Keep all follow-up visits  as told by your health care provider. This is important. This information is not intended to replace advice given to you by your health care provider. Make sure you discuss any questions you have with your health care provider. Document Released: 11/08/2005 Document Revised: 04/10/2018 Document Reviewed: 04/10/2018 Elsevier Interactive Patient Education  2019 [employer] Inc[employer]  Allergies, Adult An allergy is when your body's defense system (immune system) overreacts to an otherwise harmless substance (allergen) that you breathe in or eat or something that touches your skin. When you come into contact with something that you are allergic to, your immune system produces certain proteins (antibodies). These proteins cause cells to release chemicals (histamines) that trigger the symptoms of an allergic reaction. Allergies often affect the nasal passages (allergic rhinitis), eyes (allergic conjunctivitis), skin (atopic dermatitis), and stomach. Allergies can be mild or severe. Allergies cannot spread from person to person (are not contagious). They can develop at any age and may be outgrown. What increases the risk? You may be at greater risk of allergies if other people in your family have allergies. What are the signs or symptoms? Symptoms depend on what type of allergy you have. They may include:  Runny, stuffy nose.  Sneezing.  Itchy mouth, ears, or throat.  Postnasal drip.  Sore throat.  Itchy, red, watery, or puffy eyes.  Skin rash or hives.  Stomach pain.  Vomiting.  Diarrhea.  Bloating.  Wheezing or coughing. People with a severe allergy to food, medicine, or an insect bite may have a life-threatening allergic reaction (anaphylaxis). Symptoms of anaphylaxis include:  Hives.  Itching.  Flushed face.  Swollen lips, tongue, or mouth.  Tight or swollen throat.  Chest pain or tightness in the chest.  Trouble breathing or shortness of breath.  Rapid heartbeat.   Dizziness or fainting.  Vomiting.  Diarrhea.  Pain in the abdomen. How is this diagnosed? This condition is diagnosed based on:  Your symptoms.  Your family and medical history.  A physical exam. You may need to see a health care provider who specializes in treating allergies (allergist). You may also have tests, including:  Skin tests to see which allergens are causing your symptoms, such as: ? Skin prick test. In this test, your skin is pricked with a tiny needle and exposed to small amounts of possible allergens to see if your skin reacts. ? Intradermal skin test. In this test, a small amount of allergen is injected under your skin to see if your skin reacts. ? Patch test. In this test, a small amount of allergen is placed on your skin and then your skin is covered with a bandage. Your health care provider will check your skin after a couple of days to see if a rash has developed.  Blood tests.  Challenges tests. In this test, you inhale a small amount of allergen by mouth to see if you have an allergic reaction. You may also be asked to:  Keep a food diary. A food diary is a record of all the foods and drinks you have in a day  and any symptoms you experience.  Practice an elimination diet. An elimination diet involves eliminating specific foods from your diet and then adding them back in one by one to find out if a certain food causes an allergic reaction. How is this treated? Treatment for allergies depends on your symptoms. Treatment may include:  Cold compresses to soothe itching and swelling.  Eye drops.  Nasal sprays.  Using a saline spray or container (neti pot) to flush out the nose (nasal irrigation). These methods can help clear away mucus and keep the nasal passages moist.  Using a humidifier.  Oral antihistamines or other medicines to block allergic reaction and inflammation.  Skin creams to treat rashes or itching.  Diet changes to eliminate food allergy  triggers.  Repeated exposure to tiny amounts of allergens to build up a tolerance and prevent future allergic reactions (immunotherapy). These include: ? Allergy shots. ? Oral treatment. This involves taking small doses of an allergen under the tongue (sublingual immunotherapy).  Emergency epinephrine injection (auto-injector) in case of an allergic emergency. This is a self-injectable, pre-measured medicine that must be given within the first few minutes of a serious allergic reaction. Follow these instructions at home:         Avoid known allergens whenever possible.  If you suffer from airborne allergens, wash out your nose daily. You can do this with a saline spray or a neti pot to flush out your nose (nasal irrigation).  Take over-the-counter and prescription medicines only as told by your health care provider.  Keep all follow-up visits as told by your health care provider. This is important.  If you are at risk of a severe allergic reaction (anaphylaxis), keep your auto-injector with you at all times.  If you have ever had anaphylaxis, wear a medical alert bracelet or necklace that states you have a severe allergy. Contact a health care provider if:  Your symptoms do not improve with treatment. Get help right away if:  You have symptoms of anaphylaxis, such as: ? Swollen mouth, tongue, or throat. ? Pain or tightness in your chest. ? Trouble breathing or shortness of breath. ? Dizziness or fainting. ? Severe abdominal pain, vomiting, or diarrhea. This information is not intended to replace advice given to you by your health care provider. Make sure you discuss any questions you have with your health care provider. Document Released: 02/01/2003 Document Revised: 03/09/2017 Document Reviewed: 05/26/2016 Elsevier Interactive Patient Education  2019 [employer] Inc.  Asthma, Adult  Asthma is a long-term (chronic) condition that causes recurrent episodes in which the airways  become tight and narrow. The airways are the passages that lead from the nose and mouth down into the lungs. Asthma episodes, also called asthma attacks, can cause coughing, wheezing, shortness of breath, and chest pain. The airways can also fill with mucus. During an attack, it can be difficult to breathe. Asthma attacks can range from minor to life threatening. Asthma cannot be cured, but medicines and lifestyle changes can help control it and treat acute attacks. What are the causes? This condition is believed to be caused by inherited (genetic) and environmental factors, but its exact cause is not known. There are many things that can bring on an asthma attack or make asthma symptoms worse (triggers). Asthma triggers are different for each person. Common triggers include:  Mold.  Dust.  Cigarette smoke.  Cockroaches.  Things that can cause allergy symptoms (allergens), such as animal dander or pollen from trees or grass.  Air pollutants such as household cleaners, wood smoke, smog, or Therapist, occupationalchemical odors.  Cold air, weather changes, and winds (which increase molds and pollen in the air).  Strong emotional expressions such as crying or laughing hard.  Stress.  Certain medicines (such as aspirin) or types of medicines (such as beta-blockers).  Sulfites in foods and drinks. Foods and drinks that may contain sulfites include dried fruit, potato chips, and sparkling grape juice.  Infections or inflammatory conditions such as the flu, a cold, or inflammation of the nasal membranes (rhinitis).  Gastroesophageal reflux disease (GERD).  Exercise or strenuous activity. What are the signs or symptoms? Symptoms of this condition may occur right after asthma is triggered or many hours later. Symptoms include:  Wheezing. This can sound like whistling when you breathe.  Excessive nighttime or early morning coughing.  Frequent or severe coughing with a common cold.  Chest tightness.   Shortness of breath.  Tiredness (fatigue) with minimal activity. How is this diagnosed? This condition is diagnosed based on:  Your medical history.  A physical exam.  Tests, which may include: ? Lung function studies and pulmonary studies (spirometry). These tests can evaluate the flow of air in your lungs. ? Allergy tests. ? Imaging tests, such as X-rays. How is this treated? There is no cure for this condition, but treatment can help control your symptoms. Treatment for asthma usually involves:  Identifying and avoiding your asthma triggers.  Using medicines to control your symptoms. Generally, two types of medicines are used to treat asthma: ? Controller medicines. These help prevent asthma symptoms from occurring. They are usually taken every day. ? Fast-acting reliever or rescue medicines. These quickly relieve asthma symptoms by widening the narrow and tight airways. They are used as needed and provide short-term relief.  Using supplemental oxygen. This may be needed during a severe episode.  Using other medicines, such as: ? Allergy medicines, such as antihistamines, if your asthma attacks are triggered by allergens. ? Immune medicines (immunomodulators). These are medicines that help control the immune system.  Creating an asthma action plan. An asthma action plan is a written plan for managing and treating your asthma attacks. This plan includes: ? A list of your asthma triggers and how to avoid them. ? Information about when medicines should be taken and when their dosage should be changed. ? Instructions about using a device called a peak flow meter. A peak flow meter measures how well the lungs are working and the severity of your asthma. It helps you monitor your condition. Follow these instructions at home: Controlling your home environment Control your home environment in the following ways to help avoid triggers and prevent asthma attacks:  Change your heating  and air conditioning filter regularly.  Limit your use of fireplaces and wood stoves.  Get rid of pests (such as roaches and mice) and their droppings.  Throw away plants if you see mold on them.  Clean floors and dust surfaces regularly. Use unscented cleaning products.  Try to have someone else vacuum for you regularly. Stay out of rooms while they are being vacuumed and for a short while afterward. If you vacuum, use a dust mask from a hardware store, a double-layered or microfilter vacuum cleaner bag, or a vacuum cleaner with a HEPA filter.  Replace carpet with wood, tile, or vinyl flooring. Carpet can trap dander and dust.  Use allergy-proof pillows, mattress covers, and box spring covers.  Keep your bedroom a trigger-free room.  Avoid pets  and keep windows closed when allergens are in the air.  Wash beddings every week in hot water and dry them in a dryer.  Use blankets that are made of polyester or cotton.  Clean bathrooms and kitchens with bleach. If possible, have someone repaint the walls in these rooms with mold-resistant paint. Stay out of the rooms that are being cleaned and painted.  Wash your hands often with soap and water. If soap and water are not available, use hand sanitizer.  Do not allow anyone to smoke in your home. General instructions  Take over-the-counter and prescription medicines only as told by your health care provider. ? Speak with your health care provider if you have questions about how or when to take the medicines. ? Make note if you are requiring more frequent dosages.  Do not use any products that contain nicotine or tobacco, such as cigarettes and e-cigarettes. If you need help quitting, ask your health care provider. Also, avoid being exposed to secondhand smoke.  Use a peak flow meter as told by your health care provider. Record and keep track of the readings.  Understand and use the asthma action plan to help minimize, or stop an asthma  attack, without needing to seek medical care.  Make sure you stay up to date on your yearly vaccinations as told by your health care provider. This may include vaccines for the flu and pneumonia.  Avoid outdoor activities when allergen counts are high and when air quality is low.  Wear a ski mask that covers your nose and mouth during outdoor winter activities. Exercise indoors on cold days if you can.  Warm up before exercising, and take time for a cool-down period after exercise.  Keep all follow-up visits as told by your health care provider. This is important. Where to find more information  For information about asthma, turn to the Centers for Disease Control and Prevention at http://www.mills-berg.com/.htm  For air quality information, turn to [employer] at [employer] Contact a health care provider if:  You have wheezing, shortness of breath, or a cough even while you are taking medicine to prevent attacks.  The mucus you cough up (sputum) is thicker than usual.  Your sputum changes from clear or white to yellow, green, gray, or bloody.  Your medicines are causing side effects, such as a rash, itching, swelling, or trouble breathing.  You need to use a reliever medicine more than 2-3 times a week.  Your peak flow reading is still at 50-79% of your personal best after following your action plan for 1 hour.  You have a fever. Get help right away if:  You are getting worse and do not respond to treatment during an asthma attack.  You are short of breath when at rest or when doing very little physical activity.  You have difficulty eating, drinking, or talking.  You have chest pain or tightness.  You develop a fast heartbeat or palpitations.  You have a bluish color to your lips or fingernails.  You are light-headed or dizzy, or you faint.  Your peak flow reading is less than 50% of your personal best.  You feel too tired to breathe normally. Summary  Asthma is  a long-term (chronic) condition that causes recurrent episodes in which the airways become tight and narrow. These episodes can cause coughing, wheezing, shortness of breath, and chest pain.  Asthma cannot be cured, but medicines and lifestyle changes can help control it and treat acute attacks.  Make  sure you understand how to avoid triggers and how and when to use your medicines.  Asthma attacks can range from minor to life threatening. Get help right away if you have an asthma attack and do not respond to treatment with your usual rescue medicines. This information is not intended to replace advice given to you by your health care provider. Make sure you discuss any questions you have with your health care provider. Document Released: 11/08/2005 Document Revised: 12/13/2016 Document Reviewed: 12/13/2016 Elsevier Interactive Patient Education  2019 [employer].

## 2019-03-08 NOTE — Progress Notes (Signed)
Virtual Visit via Video Note Doxy  I connected with Patricia Reynolds  on 03/08/19 at 10:35 AM EDT by a video enabled telemedicine application and verified that I am speaking with the correct person using two identifiers.  Location patient: car parked Location provider:work  Persons participating in the virtual visit: patient, provider  I discussed the limitations of evaluation and management by telemedicine and the availability of in person appointments. The patient expressed understanding and agreed to proceed.   HPI: 1. HTN, chronic sys. CHF she is taking meds but not checking her BP  2. C/o Feb/[employer] in [place] went to ED/Urgent care had CXR was sick but feeling better still has wheezing in her chest since the pollen and she is c/w asthma allergies not tried otc allergy pill also c/o itchy watery eyes. She denies sx's of COVID works at [employer] 3. Intertrigo she needs refill of meds sent to her pharmacy      ROS: See pertinent positives and negatives per HPI.  Past Medical History:  Diagnosis Date  . Bronchitis    11/2017   . Chicken pox   . Chronic systolic heart failure ([place]) 03/14/2012   Echo 03/14/12: Mild LVH, EF 32-20%, grade 2 diastolic dysfunction, mild LAE.  She saw Dr. Percival Spanish on 04/16/12.  Marland Kitchen Hypertension   . Maternal anemia complicating pregnancy, childbirth, or the puerperium 09/22/2011  . NICM (nonischemic cardiomyopathy) [facility])     Past Surgical History:  Procedure Laterality Date  . CESAREAN SECTION  09/21/2011   Procedure: CESAREAN SECTION;  Surgeon: Agnes Lawrence, MD;  Location: [facility];  Service: Gynecology;  Laterality: N/A;  . FINGER SURGERY     right thumb repair 2006 playing Basketball     Family History  Problem Relation Age of Onset  . Hypertension Mother   . Diabetes type II Mother   . Diabetes Mother   . Hypertension Father   . Hyperlipidemia Father   . Depression Maternal Grandmother   . Stroke Paternal Grandmother   . Anesthesia problems Neg  Hx   . Hypotension Neg Hx   . Malignant hyperthermia Neg Hx   . Pseudochol deficiency Neg Hx   . Heart attack Neg Hx     SOCIAL HX: still working [employer]   Current Outpatient Medications:  .  albuterol (PROVENTIL HFA;VENTOLIN HFA) 108 (90 Base) MCG/ACT inhaler, Inhale 1-2 puffs into the lungs every 6 (six) hours as needed for wheezing or shortness of breath., Disp: 1 Inhaler, Rfl: 12 .  carvedilol (COREG) 6.25 MG tablet, Take 1 tablet (6.25 mg total) by mouth 2 (two) times daily with a meal., Disp: 180 tablet, Rfl: 3 .  cetirizine (ZYRTEC) 10 MG tablet, Take 1 tablet (10 mg total) by mouth at bedtime as needed for allergies., Disp: 90 tablet, Rfl: 3 .  clotrimazole (LOTRIMIN) 1 % cream, Apply 1 application topically 2 (two) times daily., Disp: 60 g, Rfl: 11 .  furosemide (LASIX) 40 MG tablet, Take 1 tablet (40 mg) by mouth once daily in the morning, Disp: 90 tablet, Rfl: 3 .  lisinopril (PRINIVIL,ZESTRIL) 40 MG tablet, Take 1 tablet (40 mg total) by mouth daily., Disp: 90 tablet, Rfl: 3 .  olopatadine (PATANOL) 0.1 % ophthalmic solution, Place 1 drop into both eyes 2 (two) times daily., Disp: 5 mL, Rfl: 12 .  spironolactone (ALDACTONE) 25 MG tablet, Take 1 tablet (25 mg) by mouth once daily, Disp: 90 tablet, Rfl: 3 .  talc (ZEASORB) powder, Apply topically as needed., Disp:  425 g, Rfl: 12  EXAM:  VITALS per patient if applicable:  GENERAL: alert, oriented, appears well and in no acute distress  HEENT: atraumatic, conjunttiva clear, no obvious abnormalities on inspection of external nose and ears  NECK: normal movements of the head and neck  LUNGS: on inspection no signs of respiratory distress, breathing rate appears normal, no obvious gross SOB, gasping or wheezing  CV: no obvious cyanosis  MS: moves all visible extremities without noticeable abnormality  PSYCH/NEURO: pleasant and cooperative, no obvious depression or anxiety, speech and thought processing grossly  intact  ASSESSMENT AND PLAN:  Discussed the following assessment and plan:  Intertrigo - Plan: clotrimazole (LOTRIMIN) 1 % cream, talc (ZEASORB) powder. Use the cream with area is red and irritated   Bronchospasm - Plan: albuterol (PROVENTIL HFA;VENTOLIN HFA) 108 (90 Base) MCG/ACT inhaler 1-2 puffs every 4-6 hours at needed for cough, wheezing, shortness of breath and chest tightness   -please get me a copy of the chest Xray our fax # is [phone] attn Dr. Olivia Mackie McLean-Scocuzza   Allergic state, initial encounter - Plan: olopatadine (PATANOL) 0.1 % ophthalmic solution, cetirizine (ZYRTEC) 10 MG tablet  -call back when ready for referral to the allergy doctor or lung doctor for lung function tests to work up for asthma   Nonischemic cardiomyopathy (HCC)/HTN/ chronic systolic CHF  Hyperlipidemia, unspecified hyperlipidemia type -schedule fasting labs  -follow up with Dr. Rockey Situ 04/2019 call to schedule an appt   Health maintenance  sch fasting labs summer 2020  Had flu shot late 08/2018, Tdap 09/2017  Hep B immune  Pap at f/u  Had polio vaccines, MMR, Hib   I discussed the assessment and treatment plan with the patient. The patient was provided an opportunity to ask questions and all were answered. The patient agreed with the plan and demonstrated an understanding of the instructions.   The patient was advised to call back or seek an in-person evaluation if the symptoms worsen or if the condition fails to improve as anticipated.  Time spent 25 minutes  Delorise Jackson, MD

## 2019-05-06 ENCOUNTER — Encounter (INDEPENDENT_AMBULATORY_CARE_PROVIDER_SITE_OTHER): Payer: BC Managed Care – PPO | Admitting: Internal Medicine

## 2019-05-06 DIAGNOSIS — M542 Cervicalgia: Secondary | ICD-10-CM | POA: Diagnosis not present

## 2019-05-07 ENCOUNTER — Encounter: Payer: Self-pay | Admitting: Internal Medicine

## 2019-05-07 ENCOUNTER — Other Ambulatory Visit: Payer: Self-pay | Admitting: Internal Medicine

## 2019-05-07 DIAGNOSIS — M542 Cervicalgia: Secondary | ICD-10-CM

## 2019-05-07 DIAGNOSIS — M545 Low back pain, unspecified: Secondary | ICD-10-CM

## 2019-05-07 MED ORDER — OXYCODONE-ACETAMINOPHEN 5-325 MG PO TABS: 0.5000 | ORAL_TABLET | Freq: Two times a day (BID) | ORAL | 0 refills | Status: DC | PRN
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
## 2019-07-09 ENCOUNTER — Other Ambulatory Visit: Payer: BC Managed Care – PPO

## 2019-07-18 ENCOUNTER — Encounter: Payer: BC Managed Care – PPO | Admitting: Internal Medicine

## 2019-07-18 DIAGNOSIS — Z0289 Encounter for other administrative examinations: Secondary | ICD-10-CM

## 2019-07-24 DIAGNOSIS — U071 COVID-19: Secondary | ICD-10-CM

## 2019-07-24 HISTORY — DX: COVID-19: U07.1

## 2019-08-06 ENCOUNTER — Encounter: Payer: Self-pay | Admitting: Internal Medicine

## 2019-08-08 ENCOUNTER — Encounter: Payer: Self-pay | Admitting: Internal Medicine

## 2019-08-08 ENCOUNTER — Ambulatory Visit (INDEPENDENT_AMBULATORY_CARE_PROVIDER_SITE_OTHER): Payer: BC Managed Care – PPO | Admitting: Internal Medicine

## 2019-08-08 DIAGNOSIS — I517 Cardiomegaly: Secondary | ICD-10-CM

## 2019-08-08 DIAGNOSIS — R05 Cough: Secondary | ICD-10-CM | POA: Diagnosis not present

## 2019-08-08 DIAGNOSIS — I429 Cardiomyopathy, unspecified: Secondary | ICD-10-CM

## 2019-08-08 DIAGNOSIS — U071 COVID-19: Secondary | ICD-10-CM | POA: Insufficient documentation

## 2019-08-08 DIAGNOSIS — R0602 Shortness of breath: Secondary | ICD-10-CM

## 2019-08-08 DIAGNOSIS — R059 Cough, unspecified: Secondary | ICD-10-CM

## 2019-08-08 MED ORDER — BENZONATATE 200 MG PO CAPS: 200.0000 mg | ORAL_CAPSULE | Freq: Three times a day (TID) | ORAL | 0 refills | Status: DC | PRN

## 2019-08-08 MED ORDER — HYDROCOD POLST-CPM POLST ER 10-8 MG/5ML PO SUER: 5.0000 mL | Freq: Every evening | ORAL | 0 refills | Status: DC | PRN

## 2019-08-08 NOTE — Progress Notes (Signed)
Virtual Visit via Video Note  I connected with Patricia Reynolds  on 08/08/19 at 10:00 AM EDT by a video enabled telemedicine application and verified that I am speaking with the correct person using two identifiers.  Location patient: home Location provider:work or home office Persons participating in the virtual visit: patient, provider  I discussed the limitations of evaluation and management by telemedicine and the availability of in person appointments. The patient expressed understanding and agreed to proceed.   HPI: 1. covid + 08/07/2019 sick since Monday with cough dry, sob, w/o fever. No sick contacts she knows of. With cough chest and back and hurting, tried albuterol inhaler w/o relief      ROS: See pertinent positives and negatives per HPI.  Past Medical History:  Diagnosis Date  . Bronchitis    11/2017   . Chicken pox   . Chronic systolic heart failure ([place]) 03/14/2012   Echo 03/14/12: Mild LVH, EF 03-50%, grade 2 diastolic dysfunction, mild LAE.  She saw Dr. Percival Spanish on 04/16/12.  Marland Kitchen Hypertension   . Maternal anemia complicating pregnancy, childbirth, or the puerperium 09/22/2011  . NICM (nonischemic cardiomyopathy) [facility])     Past Surgical History:  Procedure Laterality Date  . CESAREAN SECTION  09/21/2011   Procedure: CESAREAN SECTION;  Surgeon: Agnes Lawrence, MD;  Location: [facility];  Service: Gynecology;  Laterality: N/A;  . FINGER SURGERY     right thumb repair 2006 playing Basketball     Family History  Problem Relation Age of Onset  . Hypertension Mother   . Diabetes type II Mother   . Diabetes Mother   . Hypertension Father   . Hyperlipidemia Father   . Depression Maternal Grandmother   . Stroke Paternal Grandmother   . Anesthesia problems Neg Hx   . Hypotension Neg Hx   . Malignant hyperthermia Neg Hx   . Pseudochol deficiency Neg Hx   . Heart attack Neg Hx     SOCIAL HX:  Lives in [place], [place] with boyfriend.  1 son 22 y.o  Works at  [employer] on Fridays  Wears seatbelt  Feels safe in relationship  No guns    Current Outpatient Medications:  .  albuterol (PROVENTIL HFA;VENTOLIN HFA) 108 (90 Base) MCG/ACT inhaler, Inhale 1-2 puffs into the lungs every 6 (six) hours as needed for wheezing or shortness of breath., Disp: 1 Inhaler, Rfl: 12 .  carvedilol (COREG) 6.25 MG tablet, Take 1 tablet (6.25 mg total) by mouth 2 (two) times daily with a meal., Disp: 180 tablet, Rfl: 3 .  cetirizine (ZYRTEC) 10 MG tablet, Take 1 tablet (10 mg total) by mouth at bedtime as needed for allergies., Disp: 90 tablet, Rfl: 3 .  clotrimazole (LOTRIMIN) 1 % cream, Apply 1 application topically 2 (two) times daily., Disp: 60 g, Rfl: 11 .  furosemide (LASIX) 40 MG tablet, Take 1 tablet (40 mg) by mouth once daily in the morning, Disp: 90 tablet, Rfl: 3 .  lisinopril (PRINIVIL,ZESTRIL) 40 MG tablet, Take 1 tablet (40 mg total) by mouth daily., Disp: 90 tablet, Rfl: 3 .  olopatadine (PATANOL) 0.1 % ophthalmic solution, Place 1 drop into both eyes 2 (two) times daily., Disp: 5 mL, Rfl: 12 .  spironolactone (ALDACTONE) 25 MG tablet, Take 1 tablet (25 mg) by mouth once daily, Disp: 90 tablet, Rfl: 3 .  benzonatate (TESSALON) 200 MG capsule, Take 1 capsule (200 mg total) by mouth 3 (three) times daily as needed for cough., Disp: 45 capsule, Rfl: 0 .  chlorpheniramine-HYDROcodone (TUSSIONEX PENNKINETIC ER) 10-8 MG/5ML SUER, Take 5 mLs by mouth at bedtime as needed for cough., Disp: 140 mL, Rfl: 0  EXAM:  VITALS per patient if applicable:  GENERAL: alert, oriented, appears well and in no acute distress  HEENT: atraumatic, conjunttiva clear, no obvious abnormalities on inspection of external nose and ears  NECK: normal movements of the head and neck  LUNGS: on inspection no signs of respiratory distress, breathing rate appears normal, no obvious gross SOB, gasping or wheezing,  +coughing uncontrollably on exam  CV: no obvious cyanosis  MS: moves all  visible extremities without noticeable abnormality  PSYCH/NEURO: pleasant and cooperative, no obvious depression or anxiety, speech and thought processing grossly intact  ASSESSMENT AND PLAN:  Discussed the following assessment and plan:  COVID-19 virus detected - Plan: benzonatate (TESSALON) 200 MG capsule, chlorpheniramine-HYDROcodone (TUSSIONEX PENNKINETIC ER) 10-8 MG/5ML SUER, MyChart COVID-19 home monitoring program, Temperature monitoring  Cough - Plan: benzonatate (TESSALON) 200 MG capsule, chlorpheniramine-HYDROcodone (TUSSIONEX PENNKINETIC ER) 10-8 MG/5ML SUER, MyChart COVID-19 home monitoring program, Temperature monitoring  Note to be out of work until 08/23/19  rec warm tea honey and lemon, cough drops and mucinex dm green label or robitussin dm as well  Use albuterol inhaler Q4 hours  If worse go to ED [facility] on quarantine    -we discussed possible serious and likely etiologies, options for evaluation and workup, limitations of telemedicine visit vs in person visit, treatment, treatment risks and precautions. Pt prefers to treat via telemedicine empirically rather then risking or undertaking an in person visit at this moment. Patient agrees to seek prompt in person care if worsening, new symptoms arise, or if is not improving with treatment.   I discussed the assessment and treatment plan with the patient. The patient was provided an opportunity to ask questions and all were answered. The patient agreed with the plan and demonstrated an understanding of the instructions.   The patient was advised to call back or seek an in-person evaluation if the symptoms worsen or if the condition fails to improve as anticipated.  Time spent 15 minutes Bevelyn Buckles, MD

## 2019-08-14 ENCOUNTER — Encounter: Payer: Self-pay | Admitting: Internal Medicine

## 2019-08-24 ENCOUNTER — Ambulatory Visit
Admission: RE | Admit: 2019-08-24 | Discharge: 2019-08-24 | Disposition: A | Discharge status: Home / Self Care | Payer: BC Managed Care – PPO | Source: Ambulatory Visit | Attending: Internal Medicine | Admitting: Internal Medicine

## 2019-08-24 ENCOUNTER — Encounter: Payer: Self-pay | Admitting: Internal Medicine

## 2019-08-24 ENCOUNTER — Other Ambulatory Visit: Payer: Self-pay | Admitting: Internal Medicine

## 2019-08-24 DIAGNOSIS — R05 Cough: Secondary | ICD-10-CM

## 2019-08-24 DIAGNOSIS — U071 COVID-19: Secondary | ICD-10-CM

## 2019-08-24 DIAGNOSIS — R059 Cough, unspecified: Secondary | ICD-10-CM

## 2019-10-02 ENCOUNTER — Encounter: Payer: Self-pay | Admitting: Internal Medicine

## 2019-10-02 NOTE — Addendum Note (Signed)
Addended by: Orland Mustard on: 10/02/2019 03:36 PM   Modules accepted: Orders

## 2019-10-03 LAB — PRO B NATRIURETIC PEPTIDE: NT-Pro BNP: 1154 pg/mL — ABNORMAL HIGH (ref 0–130)

## 2019-10-03 LAB — D-DIMER, QUANTITATIVE: D-DIMER: 0.31 mg/L FEU (ref 0.00–0.49)

## 2019-10-05 ENCOUNTER — Other Ambulatory Visit: Payer: Self-pay | Admitting: Internal Medicine

## 2019-10-05 DIAGNOSIS — I428 Other cardiomyopathies: Secondary | ICD-10-CM

## 2019-10-08 NOTE — Telephone Encounter (Signed)
Patient has appointment with cariology on 10/16/19 do I need to do anything else with this .

## 2019-10-12 ENCOUNTER — Other Ambulatory Visit: Payer: Self-pay

## 2019-10-12 ENCOUNTER — Ambulatory Visit
Admission: RE | Admit: 2019-10-12 | Discharge: 2019-10-12 | Disposition: A | Discharge status: Home / Self Care | Payer: BC Managed Care – PPO | Source: Ambulatory Visit | Attending: Internal Medicine | Admitting: Internal Medicine

## 2019-10-12 DIAGNOSIS — I5022 Chronic systolic (congestive) heart failure: Secondary | ICD-10-CM | POA: Diagnosis present

## 2019-10-12 DIAGNOSIS — I429 Cardiomyopathy, unspecified: Secondary | ICD-10-CM

## 2019-10-12 DIAGNOSIS — R0602 Shortness of breath: Secondary | ICD-10-CM | POA: Insufficient documentation

## 2019-10-12 DIAGNOSIS — I428 Other cardiomyopathies: Secondary | ICD-10-CM | POA: Insufficient documentation

## 2019-10-12 DIAGNOSIS — R05 Cough: Secondary | ICD-10-CM | POA: Diagnosis not present

## 2019-10-12 DIAGNOSIS — R059 Cough, unspecified: Secondary | ICD-10-CM

## 2019-10-12 NOTE — Progress Notes (Signed)
*  PRELIMINARY RESULTS* Echocardiogram 2D Echocardiogram has been performed.  [name] 10/12/2019, 10:34 AM

## 2019-10-16 ENCOUNTER — Ambulatory Visit (INDEPENDENT_AMBULATORY_CARE_PROVIDER_SITE_OTHER): Payer: BC Managed Care – PPO | Admitting: Nurse Practitioner

## 2019-10-16 ENCOUNTER — Other Ambulatory Visit: Payer: Self-pay

## 2019-10-16 ENCOUNTER — Other Ambulatory Visit: Payer: Self-pay | Admitting: Internal Medicine

## 2019-10-16 ENCOUNTER — Encounter: Payer: Self-pay | Admitting: Nurse Practitioner

## 2019-10-16 ENCOUNTER — Other Ambulatory Visit: Payer: Self-pay | Admitting: Cardiovascular Disease

## 2019-10-16 VITALS — BP 100/70 | HR 76 | Temp 97.1°F | Ht 66.0 in | Wt 236.8 lb

## 2019-10-16 DIAGNOSIS — I428 Other cardiomyopathies: Secondary | ICD-10-CM

## 2019-10-16 DIAGNOSIS — I517 Cardiomegaly: Secondary | ICD-10-CM

## 2019-10-16 DIAGNOSIS — I10 Essential (primary) hypertension: Secondary | ICD-10-CM

## 2019-10-16 DIAGNOSIS — I5020 Unspecified systolic (congestive) heart failure: Secondary | ICD-10-CM | POA: Diagnosis not present

## 2019-10-16 DIAGNOSIS — U071 COVID-19: Secondary | ICD-10-CM

## 2019-10-16 DIAGNOSIS — I110 Hypertensive heart disease with heart failure: Secondary | ICD-10-CM | POA: Diagnosis not present

## 2019-10-16 DIAGNOSIS — I5022 Chronic systolic (congestive) heart failure: Secondary | ICD-10-CM

## 2019-10-16 MED ORDER — LOSARTAN POTASSIUM 100 MG PO TABS: 100.0000 mg | ORAL_TABLET | Freq: Every day | ORAL | 3 refills | Status: DC
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
## 2019-10-16 NOTE — Patient Instructions (Signed)
Medication Instructions:  1- STOP Lisinopril 2- START Losartan Take 1 tablet (100 mg total) by mouth daily *If you need a refill on your cardiac medications before your next appointment, please call your pharmacy*  Lab Work: None ordered If you have labs (blood work) drawn today and your tests are completely normal, you will receive your results only by: Marland Kitchen MyChart Message (if you have MyChart) OR . A paper copy in the mail If you have any lab test that is abnormal or we need to change your treatment, we will call you to review the results.  Testing/Procedures: None ordered   Follow-Up: At [facility], you and your health needs are our priority.  As part of our continuing mission to provide you with exceptional heart care, we have created designated Provider Care Teams.  These Care Teams include your primary Cardiologist (physician) and Advanced Practice Providers (APPs -  Physician Assistants and Nurse Practitioners) who all work together to provide you with the care you need, when you need it.  Your next appointment:   PRN following AHF clinic   Other Instructions Referral to [place], [place]

## 2019-10-23 ENCOUNTER — Telehealth: Payer: Self-pay

## 2019-10-23 NOTE — Telephone Encounter (Signed)
Call to patient to see if she has got in touch with advanced heart failure clinic and make appt with Ignacia Bayley, NP for next Thursday or Friday afternoon (he will be in the hospital).   No answer, LMTCB.

## 2019-10-23 NOTE — Telephone Encounter (Signed)
-----   Message from Theora Gianotti, NP sent at 10/22/2019  5:06 PM EST ----- Gwen Pounds,  I saw Ms. Nierenberg last week and we referred to advanced CHF.  My thought was that if they couldn't see her w/in a 2 wk period, I would see her back.  It doesn't look like she has an appt w/ them yet, so would you pls arrange for f/u w/ me sometime next week (Wed, Thurs, or Fri fine - just make a PM slot Thurs or Fri since I'm also in the hospital).  Thanks,  Gerald Stabs

## 2019-10-23 NOTE — Telephone Encounter (Signed)
Call returned by patient. She has not got in touch with advanced heart failure clinic but agrees to call them today.   Pt agrees to come in for appt next Friday at 2 pm with Ignacia Bayley, NP.   I will contact scheduling.

## 2019-10-31 ENCOUNTER — Telehealth (HOSPITAL_COMMUNITY): Payer: Self-pay | Admitting: Vascular Surgery

## 2019-10-31 NOTE — Telephone Encounter (Signed)
Left pt message to make new pt appt w/ db week of 12/21

## 2019-11-02 ENCOUNTER — Encounter: Payer: Self-pay | Admitting: Nurse Practitioner

## 2019-11-02 ENCOUNTER — Ambulatory Visit (INDEPENDENT_AMBULATORY_CARE_PROVIDER_SITE_OTHER): Payer: BC Managed Care – PPO | Admitting: Nurse Practitioner

## 2019-11-02 ENCOUNTER — Other Ambulatory Visit: Payer: Self-pay

## 2019-11-02 ENCOUNTER — Other Ambulatory Visit: Payer: Self-pay | Admitting: Cardiovascular Disease

## 2019-11-02 VITALS — BP 100/82 | HR 83 | Temp 96.6°F | Ht 66.0 in | Wt 240.8 lb

## 2019-11-02 DIAGNOSIS — I428 Other cardiomyopathies: Secondary | ICD-10-CM

## 2019-11-02 DIAGNOSIS — I5020 Unspecified systolic (congestive) heart failure: Secondary | ICD-10-CM | POA: Diagnosis not present

## 2019-11-02 DIAGNOSIS — I110 Hypertensive heart disease with heart failure: Secondary | ICD-10-CM

## 2019-11-02 DIAGNOSIS — Z79899 Other long term (current) drug therapy: Secondary | ICD-10-CM | POA: Diagnosis not present

## 2019-11-02 DIAGNOSIS — I5022 Chronic systolic (congestive) heart failure: Secondary | ICD-10-CM

## 2019-11-02 MED ORDER — SACUBITRIL-VALSARTAN 24-26 MG PO TABS: ORAL_TABLET | ORAL | 6 refills | Status: DC
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
## 2019-11-02 NOTE — Patient Instructions (Addendum)
Medication Instructions:  - Your physician has recommended you make the following change in your medication:   1) Stop losartan  2) Start entresto 24/26 mg- take 1 tablet by mouth twice   - please take your free 30-day trial with you to the pharmacy when you pick up your prescription - also visit [email or web address], click on  "sign up and save" to obtain your $10 copay card that is valid for patients with commercial insurance  *If you need a refill on your cardiac medications before your next appointment, please call your pharmacy*  Lab Work: - Your physician recommends that you have lab work in: 1 week- BMP (order given to take to your primary care doctor)  If you have labs (blood work) drawn today and your tests are completely normal, you will receive your results only by: Marland Kitchen MyChart Message (if you have MyChart) OR . A paper copy in the mail If you have any lab test that is abnormal or we need to change your treatment, we will call you to review the results.  Testing/Procedures: - none ordered  Follow-Up: At [facility], you and your health needs are our priority.  As part of our continuing mission to provide you with exceptional heart care, we have created designated Provider Care Teams.  These Care Teams include your primary Cardiologist (physician) and Advanced Practice Providers (APPs -  Physician Assistants and Nurse Practitioners) who all work together to provide you with the care you need, when you need it.  Your next appointment:   3 month(s)  The format for your next appointment:   In Person  Provider:   Ida Rogue, MD  Other Instructions - follow up with CHF clinic

## 2019-11-05 ENCOUNTER — Telehealth (HOSPITAL_COMMUNITY): Payer: Self-pay | Admitting: Vascular Surgery

## 2019-11-05 NOTE — Telephone Encounter (Signed)
Called pt to make new ch appt w/ Mclean.. per Mclean to work pt in

## 2019-11-06 ENCOUNTER — Telehealth: Payer: Self-pay

## 2019-11-06 NOTE — Telephone Encounter (Signed)
Entresto Rx KEY: [id number] Completed clinical questions-Waiting for determination from [employer]

## 2019-11-07 ENCOUNTER — Telehealth (HOSPITAL_COMMUNITY): Payer: Self-pay

## 2019-11-07 NOTE — Telephone Encounter (Signed)
Per fax from [place], Entresto 24-26mg  tabs have been approved from 11/06/2019 through 12/15/202.

## 2019-11-07 NOTE — Telephone Encounter (Signed)

## 2019-11-08 ENCOUNTER — Encounter (HOSPITAL_COMMUNITY): Payer: Self-pay | Admitting: Cardiology

## 2019-11-08 ENCOUNTER — Other Ambulatory Visit: Payer: Self-pay

## 2019-11-08 ENCOUNTER — Ambulatory Visit (HOSPITAL_COMMUNITY)
Admission: RE | Admit: 2019-11-08 | Discharge: 2019-11-08 | Disposition: A | Discharge status: Home / Self Care | Payer: BC Managed Care – PPO | Source: Ambulatory Visit | Attending: Cardiology | Admitting: Cardiology

## 2019-11-08 ENCOUNTER — Encounter: Payer: Self-pay | Admitting: Internal Medicine

## 2019-11-08 ENCOUNTER — Ambulatory Visit (INDEPENDENT_AMBULATORY_CARE_PROVIDER_SITE_OTHER): Payer: BC Managed Care – PPO | Admitting: Internal Medicine

## 2019-11-08 VITALS — BP 100/80 | Ht 66.0 in | Wt 240.0 lb

## 2019-11-08 DIAGNOSIS — I110 Hypertensive heart disease with heart failure: Secondary | ICD-10-CM | POA: Insufficient documentation

## 2019-11-08 DIAGNOSIS — I10 Essential (primary) hypertension: Secondary | ICD-10-CM

## 2019-11-08 DIAGNOSIS — R000 Tachycardia, unspecified: Secondary | ICD-10-CM | POA: Diagnosis not present

## 2019-11-08 DIAGNOSIS — Z1389 Encounter for screening for other disorder: Secondary | ICD-10-CM

## 2019-11-08 DIAGNOSIS — E559 Vitamin D deficiency, unspecified: Secondary | ICD-10-CM

## 2019-11-08 DIAGNOSIS — I5022 Chronic systolic (congestive) heart failure: Secondary | ICD-10-CM

## 2019-11-08 DIAGNOSIS — Z0000 Encounter for general adult medical examination without abnormal findings: Secondary | ICD-10-CM | POA: Diagnosis not present

## 2019-11-08 DIAGNOSIS — E785 Hyperlipidemia, unspecified: Secondary | ICD-10-CM

## 2019-11-08 DIAGNOSIS — Z8619 Personal history of other infectious and parasitic diseases: Secondary | ICD-10-CM | POA: Insufficient documentation

## 2019-11-08 DIAGNOSIS — Z79899 Other long term (current) drug therapy: Secondary | ICD-10-CM | POA: Diagnosis not present

## 2019-11-08 DIAGNOSIS — Z1329 Encounter for screening for other suspected endocrine disorder: Secondary | ICD-10-CM

## 2019-11-08 DIAGNOSIS — R9431 Abnormal electrocardiogram [ECG] [EKG]: Secondary | ICD-10-CM | POA: Diagnosis not present

## 2019-11-08 LAB — BASIC METABOLIC PANEL
Anion gap: 9 (ref 5–15)
BUN: 12 mg/dL (ref 6–20)
CO2: 23 mmol/L (ref 22–32)
Calcium: 9.3 mg/dL (ref 8.9–10.3)
Chloride: 104 mmol/L (ref 98–111)
Creatinine, Ser: 0.82 mg/dL (ref 0.44–1.00)
GFR calc Af Amer: >60 mL/min (ref >60)
GFR calc non Af Amer: >60 mL/min (ref >60)
Glucose, Bld: 100 mg/dL — ABNORMAL HIGH (ref 70–99)
Potassium: 4 mmol/L (ref 3.5–5.1)
Sodium: 136 mmol/L (ref 135–145)

## 2019-11-08 LAB — BRAIN NATRIURETIC PEPTIDE: B Natriuretic Peptide: 135.4 pg/mL — ABNORMAL HIGH (ref 0.0–100.0)

## 2019-11-08 MED ORDER — SPIRONOLACTONE 25 MG PO TABS: 25.0000 mg | ORAL_TABLET | Freq: Every day | ORAL | 3 refills | Status: DC

## 2019-11-08 MED ORDER — CARVEDILOL 6.25 MG PO TABS: 9.3750 mg | ORAL_TABLET | Freq: Two times a day (BID) | ORAL | 2 refills | Status: DC

## 2019-11-08 MED ORDER — SPIRONOLACTONE 25 MG PO TABS: 12.5000 mg | ORAL_TABLET | Freq: Once | ORAL | 0 refills | Status: DC

## 2019-11-08 MED ORDER — ALPRAZOLAM 0.25 MG PO TABS: 0.2500 mg | ORAL_TABLET | Freq: Once | ORAL | 0 refills | Status: AC

## 2019-11-08 NOTE — Patient Instructions (Addendum)
INCREASE Coreg to 9.375mg  ( 1.5 tabs) twice a day  Keep Spironolactone at 25mg  ( 1 tab) daily  Labs today We will only contact you if something comes back abnormal or we need to make some changes. Otherwise no news is good news!  You have been ordered for a cardiac MRI and and cardiac CT scan.  You will receive a call from radiology to schedule your appointment.   Take Xanax 0.25mg  ( 1 tab) 30 minutes before your MRI  Your physician recommends that you schedule a follow-up appointment in: 3 weeks to see pharmacist.  You will f/u with Dr Aundra Dubin in 6 weeks.  At the [facility], you and your health needs are our priority. As part of our continuing mission to provide you with exceptional heart care, we have created designated Provider Care Teams. These Care Teams include your primary Cardiologist (physician) and Advanced Practice Providers (APPs- Physician Assistants and Nurse Practitioners) who all work together to provide you with the care you need, when you need it.   You may see any of the following providers on your designated Care Team at your next follow up: Marland Kitchen Dr Glori Bickers . Dr Loralie Champagne . Darrick Grinder, NP . Lyda Jester, PA . Audry Riles, PharmD   Please be sure to bring in all your medications bottles to every appointment.

## 2019-11-08 NOTE — Progress Notes (Signed)
Virtual Visit via Video Note  I connected with Patricia Reynolds   on 11/08/19 at  9:00 AM EST by a video enabled telemedicine application and verified that I am speaking with the correct person using two identifiers.  Location patient: home Location provider:work or home office Persons participating in the virtual visit: patient, provider  I discussed the limitations of evaluation and management by telemedicine and the availability of in person appointments. The patient expressed understanding and agreed to proceed.   HPI: 1. Annual  2. CHF combined on entresto 24/26, lasix 40 and spironolactone 25 mg qd 10/16/19 at work she had episode of lightheadedness/presyncope and BP was 70s/50s She is drinking 16 ounces 2-3 per day  Reviewed labs 10/04/19 cmet normal and cbc with hbg 12.9  Of note she did have covid 19 08/07/2019 but is feeling better  ROS: See pertinent positives and negatives per HPI. General: wt stable  HEENT: nl hearing  CV: no chest pain  Lungs: on sob, at times some cough but better off lisinopril  GI: no blood in stool  GU: cycle almost starting  MSK: no jt pain  Neuro: no h/a, +lightheadedness at times  Psych: normal mood  Skin: no issues   Past Medical History:  Diagnosis Date  . Bronchitis    11/2017   . Chicken pox   . Chronic combined systolic (congestive) and diastolic (congestive) heart failure ([place]) 03/14/2012   a. 02/2012 Echo: EF 20-25%, diff HK, Gr2 DD; b. 08/2012 Echo: EF 45-50%, mild LVH; c. 09/2019 Echo: EF 20%, sev dil LV, glob HK. Nl RV fxn. Mod dil LA.  Marland Kitchen COVID-19 virus infection 07/2019  . Hypertensive heart disease   . Hypertensive NICM (nonischemic cardiomyopathy) ([place])    a. 02/2012 Echo: EF 20-25%, diff HK, Gr2 DD; b. 03/2012 ETT: Ex time 6:11, max HR 171, No ST/T changes; c. 08/2012 Echo: EF 45-50%, mild LVH; d. 09/2019 Echo: EF 20%, sev dil LV, glob HK. Nl RV fxn. Mod dil LA.  . Maternal anemia complicating pregnancy, childbirth, or the  puerperium 09/22/2011    Past Surgical History:  Procedure Laterality Date  . CESAREAN SECTION  09/21/2011   Procedure: CESAREAN SECTION;  Surgeon: Agnes Lawrence, MD;  Location: [facility];  Service: Gynecology;  Laterality: N/A;  . FINGER SURGERY     right thumb repair 2006 playing Basketball     Family History  Problem Relation Age of Onset  . Hypertension Mother   . Diabetes type II Mother   . Diabetes Mother   . Hypertension Father   . Hyperlipidemia Father   . Depression Maternal Grandmother   . Stroke Paternal Grandmother   . Anesthesia problems Neg Hx   . Hypotension Neg Hx   . Malignant hyperthermia Neg Hx   . Pseudochol deficiency Neg Hx   . Heart attack Neg Hx     SOCIAL HX:  Lives in [place], [place] with boyfriend.  1 son 7 y.o  Works at [employer] Wears seatbelt  Feels safe in relationship  No guns   Current Outpatient Medications:  .  albuterol (PROVENTIL HFA;VENTOLIN HFA) 108 (90 Base) MCG/ACT inhaler, Inhale 1-2 puffs into the lungs every 6 (six) hours as needed for wheezing or shortness of breath., Disp: 1 Inhaler, Rfl: 12 .  carvedilol (COREG) 6.25 MG tablet, TAKE 1 TABLET BY MOUTH TWICE DAILY WITH A MEAL., Disp: 180 tablet, Rfl: 2 .  cetirizine (ZYRTEC) 10 MG tablet, Take 1 tablet (10 mg total) by mouth at  bedtime as needed for allergies., Disp: 90 tablet, Rfl: 3 .  furosemide (LASIX) 40 MG tablet, TAKE 1 TABLET BY MOUTH IN THE MORNING., Disp: 90 tablet, Rfl: 0 .  sacubitril-valsartan (ENTRESTO) 24-26 MG, Take 1 tablet (24/26 mg) by mouth twice daily, Disp: 60 tablet, Rfl: 6 .  spironolactone (ALDACTONE) 25 MG tablet, Take 0.5 tablets (12.5 mg total) by mouth once for 1 dose. TAKE 1 TABLET BY MOUTH ONCE DAILY in the am, Disp: 90 tablet, Rfl: 0  EXAM:  VITALS per patient if applicable:  GENERAL: alert, oriented, appears well and in no acute distress  HEENT: atraumatic, conjunttiva clear, no obvious abnormalities on inspection of external nose and  ears  NECK: normal movements of the head and neck  LUNGS: on inspection no signs of respiratory distress, breathing rate appears normal, no obvious gross SOB, gasping or wheezing  CV: no obvious cyanosis  MS: moves all visible extremities without noticeable abnormality  PSYCH/NEURO: pleasant and cooperative, no obvious depression or anxiety, speech and thought processing grossly intact  ASSESSMENT AND PLAN:  Discussed the following assessment and plan:  Annual physical exam sch fasting labs today at [facility] Had flu shot late 08/2019 at work, Tdap 09/2017  Hep B immune  Pap at f/u  Had polio vaccines, MMR, Hib  Chronic systolic heart failure ([place]) - Plan: Basic Metabolic Panel (BMET), Lipid panel, spironolactone (ALDACTONE) 25 MG tablet F/u with CHF clinic today in [place] hypertension - Plan: Basic Metabolic Panel (BMET), Lipid panel Now BP low normal to hypotensive  Reduced spironolactone 25 to 12.5 mg qd   Hyperlipidemia, unspecified hyperlipidemia type - Plan: Basic Metabolic Panel (BMET), Lipid panel  Hypomagnesemia - Plan: Magnesium  Vitamin D deficiency - Plan: Vitamin D (25 hydroxy)   -we discussed possible serious and likely etiologies, options for evaluation and workup, limitations of telemedicine visit vs in person visit, treatment, treatment risks and precautions. Pt prefers to treat via telemedicine empirically rather then risking or undertaking an in person visit at this moment. Patient agrees to seek prompt in person care if worsening, new symptoms arise, or if is not improving with treatment.   I discussed the assessment and treatment plan with the patient. The patient was provided an opportunity to ask questions and all were answered. The patient agreed with the plan and demonstrated an understanding of the instructions.   The patient was advised to call back or seek an in-person evaluation if the symptoms worsen or if the condition fails to improve  as anticipated.  Time spent 20 minutes  Delorise Jackson, MD

## 2019-11-09 ENCOUNTER — Encounter: Payer: Self-pay | Admitting: Internal Medicine

## 2019-11-09 ENCOUNTER — Other Ambulatory Visit: Payer: Self-pay | Admitting: Internal Medicine

## 2019-11-09 DIAGNOSIS — E559 Vitamin D deficiency, unspecified: Secondary | ICD-10-CM | POA: Insufficient documentation

## 2019-11-09 LAB — TSH: TSH: 0.969 u[IU]/mL (ref 0.450–4.500)

## 2019-11-09 LAB — LIPID PANEL
Chol/HDL Ratio: 4.6 ratio — ABNORMAL HIGH (ref 0.0–4.4)
Cholesterol, Total: 217 mg/dL — ABNORMAL HIGH (ref 100–199)
HDL: 47 mg/dL (ref >39)
LDL Chol Calc (NIH): 152 mg/dL — ABNORMAL HIGH (ref 0–99)
Triglycerides: 103 mg/dL (ref 0–149)
VLDL Cholesterol Cal: 18 mg/dL (ref 5–40)

## 2019-11-09 LAB — URINALYSIS, ROUTINE W REFLEX MICROSCOPIC
Bilirubin, UA: NEGATIVE
Glucose, UA: NEGATIVE
Ketones, UA: NEGATIVE
Leukocytes,UA: NEGATIVE
Nitrite, UA: NEGATIVE
Protein,UA: NEGATIVE
RBC, UA: NEGATIVE
Specific Gravity, UA: 1.008 (ref 1.005–1.030)
Urobilinogen, Ur: 0.2 mg/dL (ref 0.2–1.0)
pH, UA: 6.5 (ref 5.0–7.5)

## 2019-11-09 LAB — BASIC METABOLIC PANEL
BUN/Creatinine Ratio: 11 (ref 9–23)
BUN: 11 mg/dL (ref 6–20)
CO2: 21 mmol/L (ref 20–29)
Calcium: 9.8 mg/dL (ref 8.7–10.2)
Chloride: 97 mmol/L (ref 96–106)
Creatinine, Ser: 0.96 mg/dL (ref 0.57–1.00)
GFR calc Af Amer: 90 mL/min/{1.73_m2} (ref >59)
GFR calc non Af Amer: 78 mL/min/{1.73_m2} (ref >59)
Glucose: 98 mg/dL (ref 65–99)
Potassium: 4.6 mmol/L (ref 3.5–5.2)
Sodium: 133 mmol/L — ABNORMAL LOW (ref 134–144)

## 2019-11-09 LAB — MAGNESIUM: Magnesium: 1.9 mg/dL (ref 1.6–2.3)

## 2019-11-09 LAB — VITAMIN D 25 HYDROXY (VIT D DEFICIENCY, FRACTURES): Vit D, 25-Hydroxy: 10.8 ng/mL — ABNORMAL LOW (ref 30.0–100.0)

## 2019-11-09 MED ORDER — CHOLECALCIFEROL 1.25 MG (50000 UT) PO CAPS: 50000.0000 [IU] | ORAL_CAPSULE | ORAL | 1 refills | Status: DC

## 2019-11-09 NOTE — Progress Notes (Signed)
PCP: Abe Schools-Scocuzza, Nino Glow, MD HF Cardiology: Dr. Aundra Dubin  33 yo with history of HTN and CHF was referred by Ignacia Bayley for evaluation of CHF.  Patient was first diagnosed with CHF in 4/13, about 6 months after delivering her first and only child.  LV EF at that time was 20-25% by echo. With medical management, it had improved to 45-50% by 10/13.  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  She was doing ok after that until 9/20, when she developed COVID-19 infection.  She had respiratory symptoms but recovered after a week or so.  However, she does not feel like she got back to normal.  In 11/20, she began to notice very significant exertional dyspnea and orthopnea.  Echo was done in 11/20, showing EF back down to 20% with severe LV dilation, normal RV.  She was started on Lasix and subsequently Entresto, Coreg, and spironolactone.    Symptoms are better on medications.  She notes fatigue but the dyspnea has mostly resolved.  No orthopnea/PND.  No lightheadedness.  No snoring/daytime sleepiness.  No palpitations.    ECG (12/20, personally reviewed): NSR, poor RWP  Labs (2/20): creatinine 0.77  PMH: 1. HTN 2. COVID-19 infection 9/20 3. Chronic systolic CHF: CHF diagnosed 4/13 about 6 months after delivering her 1st and only child.  Pregnancy complicated by poorly controlled HTN.  - 4/13 echo: EF 20-25% with diffuse hypokinesis.  - 10/13 echo: EF 45-50% - Echo (11/20): EF 20%, severe LV dilation, normal RV size and systolic function.   SH: Lives in [place], 1 child, works as Medical illustrator, no ETOH, no drugs, no smoking.   FH: No cardiac disease that she knows about.   ROS: All systems reviewed and negative except as per HPI.   Current Outpatient Medications  Medication Sig Dispense Refill  . albuterol (PROVENTIL HFA;VENTOLIN HFA) 108 (90 Base) MCG/ACT inhaler Inhale 1-2 puffs into the lungs every 6 (six) hours as needed for wheezing or shortness of breath. 1 Inhaler 12  .  carvedilol (COREG) 6.25 MG tablet Take 1.5 tablets (9.375 mg total) by mouth 2 (two) times daily with a meal. 180 tablet 2  . cetirizine (ZYRTEC) 10 MG tablet Take 1 tablet (10 mg total) by mouth at bedtime as needed for allergies. 90 tablet 3  . furosemide (LASIX) 40 MG tablet TAKE 1 TABLET BY MOUTH IN THE MORNING. 90 tablet 0  . sacubitril-valsartan (ENTRESTO) 24-26 MG Take 1 tablet (24/26 mg) by mouth twice daily 60 tablet 6  . spironolactone (ALDACTONE) 25 MG tablet Take 1 tablet (25 mg total) by mouth daily. 90 tablet 3  . Cholecalciferol 1.25 MG (50000 UT) capsule Take 1 capsule (50,000 Units total) by mouth once a week. 13 capsule 1   No current facility-administered medications for this encounter.    BP 105/73   Pulse 75   Wt 109.2 kg (240 lb 12.8 oz)   SpO2 98%   BMI 38.87 kg/m   General: NAD Neck: No JVD, no thyromegaly or thyroid nodule.  Lungs: Clear to auscultation bilaterally with normal respiratory effort. CV: Nondisplaced PMI.  Heart regular S1/S2, no S3/S4, no murmur.  No peripheral edema.  No carotid bruit.  Normal pedal pulses.  Abdomen: Soft, nontender, no hepatosplenomegaly, no distention.  Skin: Intact without lesions or rashes.  Neurologic: Alert and oriented x 3.  Psych: Normal affect. Extremities: No clubbing or cyanosis.  HEENT: Normal.   Assessment/Plan: 1. Chronic systolic CHF: Diagnosed after delivery of child in 4/13, thought initially  to be peri-partum cardiomyopathy.  EF improved from 20-25% to 45-50% by 10/13.  COVID-19 infection in 9/20, ongoing dyspnea prompted an echo, which showed EF 20% in 11/20.  Question if this is a new process, COVID-19 myocarditis?  She has no risk factors for CAD, no family history.  No substance abuse. She is not volume overloaded on exam, NYHA class II.  - Continue Entresto 24/26 bid.  - Increase spironolactone to 25 mg daily. BMET today and in 10 days. - Increase Coreg to 9.375 mg bid.  - Continue Lasix 40 mg daily.  -  With soft BP, next step may need to be addition of dapagliflozin.  - I will arrange for cardiac MRI to look for infiltrative disease/myocarditis.  - I will arrange for coronary CTA to rule out obstructive coronary disease (unlikely).  2. HTN: BP is not elevated at this point.   Marca Ancona 11/09/2019

## 2019-11-13 ENCOUNTER — Telehealth (HOSPITAL_COMMUNITY): Payer: Self-pay

## 2019-11-13 DIAGNOSIS — I5022 Chronic systolic (congestive) heart failure: Secondary | ICD-10-CM

## 2019-11-13 MED ORDER — FUROSEMIDE 40 MG PO TABS: 20.0000 mg | ORAL_TABLET | Freq: Every day | ORAL | 0 refills | Status: DC

## 2019-11-13 NOTE — Telephone Encounter (Signed)
-----   Message from Larey Dresser, MD sent at 11/12/2019  9:30 PM EST ----- Yes, would also have her cut Lasix back to 20 mg daily.   Nira Conn, could you call her and have her cut Lasix back to 20 mg daily? Thanks.  ----- Message ----- From: McLean-Scocuzza, Nino Glow, MD Sent: 11/12/2019   6:32 AM EST To: Larey Dresser, MD  Hi   Out mutual pts BP has been running low sbp 70-90s with symptoms on lasix 40, entresto and spironolactone 25 I told her earlier this day to reduce to 1/2 dose of spironolactone since she is on lowest entresto dose  Are you ok with this?   Thanks [employer] ----- Message ----- From: Larey Dresser, MD Sent: 11/09/2019   9:43 PM EST To: Nino Glow McLean-Scocuzza, MD

## 2019-11-28 ENCOUNTER — Encounter: Payer: Self-pay | Admitting: Internal Medicine

## 2019-12-11 ENCOUNTER — Inpatient Hospital Stay (HOSPITAL_COMMUNITY)
Admission: RE | Admit: 2019-12-11 | Discharge: 2019-12-11 | Disposition: A | Discharge status: Home / Self Care | Payer: BC Managed Care – PPO | Source: Ambulatory Visit

## 2019-12-17 ENCOUNTER — Telehealth (HOSPITAL_COMMUNITY): Payer: Self-pay | Admitting: Emergency Medicine

## 2019-12-17 ENCOUNTER — Encounter (HOSPITAL_COMMUNITY): Payer: Self-pay

## 2019-12-17 NOTE — Telephone Encounter (Signed)
Left message on voicemail with name and callback number Demarco Bacci RN Navigator Cardiac Imaging [facility] [phone] Office [phone] Cell  

## 2019-12-18 ENCOUNTER — Ambulatory Visit (HOSPITAL_COMMUNITY)
Admission: RE | Admit: 2019-12-18 | Discharge: 2019-12-18 | Disposition: A | Discharge status: Home / Self Care | Payer: BC Managed Care – PPO | Source: Ambulatory Visit | Attending: Cardiology | Admitting: Cardiology

## 2019-12-18 ENCOUNTER — Other Ambulatory Visit (HOSPITAL_COMMUNITY): Payer: BC Managed Care – PPO

## 2019-12-18 ENCOUNTER — Other Ambulatory Visit: Payer: Self-pay

## 2019-12-18 ENCOUNTER — Ambulatory Visit (HOSPITAL_COMMUNITY): Admission: RE | Admit: 2019-12-18 | Payer: BC Managed Care – PPO | Source: Ambulatory Visit

## 2019-12-18 ENCOUNTER — Encounter: Payer: BC Managed Care – PPO | Admitting: *Deleted

## 2019-12-18 DIAGNOSIS — I5022 Chronic systolic (congestive) heart failure: Secondary | ICD-10-CM

## 2019-12-18 DIAGNOSIS — Z006 Encounter for examination for normal comparison and control in clinical research program: Secondary | ICD-10-CM

## 2019-12-18 MED ORDER — NITROGLYCERIN 0.4 MG SL SUBL
SUBLINGUAL_TABLET | SUBLINGUAL | Status: AC
  Filled 2019-12-18: qty 2

## 2019-12-18 MED ORDER — IOHEXOL 350 MG/ML SOLN
100.0000 mL | Freq: Once | INTRAVENOUS | Status: AC | PRN
  Administered 2019-12-18: 100 mL via INTRAVENOUS

## 2019-12-18 MED ORDER — METOPROLOL TARTRATE 5 MG/5ML IV SOLN
INTRAVENOUS | Status: AC
  Filled 2019-12-18: qty 5

## 2019-12-18 MED ORDER — METOPROLOL TARTRATE 5 MG/5ML IV SOLN
5.0000 mg | INTRAVENOUS | Status: DC | PRN
  Administered 2019-12-18: 11:00:00 5 mg via INTRAVENOUS

## 2019-12-18 MED ORDER — NITROGLYCERIN 0.4 MG SL SUBL
0.8000 mg | SUBLINGUAL_TABLET | Freq: Once | SUBLINGUAL | Status: AC
  Administered 2019-12-18: 11:00:00 0.8 mg via SUBLINGUAL

## 2019-12-18 NOTE — Research (Signed)
Subject Name: Patricia Reynolds  Subject met inclusion and exclusion criteria.  The informed consent form, study requirements and expectations were reviewed with the subject and questions and concerns were addressed prior to the signing of the consent form.  The subject verbalized understanding of the trial requirements.  The subject agreed to participate in the CADFEM Group 4 trial and signed the informed consent at 0931 on 12/18/19  The informed consent was obtained prior to performance of any protocol-specific procedures for the subject.  A copy of the signed informed consent was given to the subject and a copy was placed in the subject's medical record.   Timoteo Gaul

## 2019-12-19 ENCOUNTER — Telehealth (HOSPITAL_COMMUNITY): Payer: Self-pay

## 2019-12-19 DIAGNOSIS — I7090 Unspecified atherosclerosis: Secondary | ICD-10-CM

## 2019-12-19 DIAGNOSIS — I5022 Chronic systolic (congestive) heart failure: Secondary | ICD-10-CM

## 2019-12-19 MED ORDER — ROSUVASTATIN CALCIUM 10 MG PO TABS: 10.0000 mg | ORAL_TABLET | Freq: Every day | ORAL | 5 refills | Status: DC

## 2019-12-19 NOTE — Telephone Encounter (Signed)
-----   Message from Laurey Morale, MD sent at 12/18/2019 10:21 PM EST ----- She has coronary disease but nonobstructive, does not explain cardiomyopathy.  However, she has age-advanced atherosclerosis and I would recommend that she start Crestor 10 mg daily with lipids/LFTs in 2 months.

## 2019-12-19 NOTE — Telephone Encounter (Signed)
Pt advised of results. Pt has an appt tomorrow and will discuss further with MD. However crestor 10mg  daily called into pharmacy. Verbalized understanding.

## 2019-12-20 ENCOUNTER — Encounter (HOSPITAL_COMMUNITY): Payer: Self-pay | Admitting: Cardiology

## 2019-12-20 ENCOUNTER — Other Ambulatory Visit: Payer: Self-pay

## 2019-12-20 ENCOUNTER — Ambulatory Visit (HOSPITAL_COMMUNITY)
Admission: RE | Admit: 2019-12-20 | Discharge: 2019-12-20 | Disposition: A | Discharge status: Home / Self Care | Payer: BC Managed Care – PPO | Source: Ambulatory Visit | Attending: Cardiology | Admitting: Cardiology

## 2019-12-20 VITALS — BP 109/75 | HR 63 | Wt 244.4 lb

## 2019-12-20 DIAGNOSIS — I5022 Chronic systolic (congestive) heart failure: Secondary | ICD-10-CM | POA: Insufficient documentation

## 2019-12-20 DIAGNOSIS — I110 Hypertensive heart disease with heart failure: Secondary | ICD-10-CM | POA: Insufficient documentation

## 2019-12-20 DIAGNOSIS — Z7901 Long term (current) use of anticoagulants: Secondary | ICD-10-CM | POA: Insufficient documentation

## 2019-12-20 DIAGNOSIS — Z8616 Personal history of COVID-19: Secondary | ICD-10-CM | POA: Diagnosis not present

## 2019-12-20 DIAGNOSIS — Z79899 Other long term (current) drug therapy: Secondary | ICD-10-CM | POA: Diagnosis not present

## 2019-12-20 DIAGNOSIS — I428 Other cardiomyopathies: Secondary | ICD-10-CM | POA: Diagnosis not present

## 2019-12-20 DIAGNOSIS — Z7984 Long term (current) use of oral hypoglycemic drugs: Secondary | ICD-10-CM | POA: Diagnosis not present

## 2019-12-20 DIAGNOSIS — I2510 Atherosclerotic heart disease of native coronary artery without angina pectoris: Secondary | ICD-10-CM | POA: Insufficient documentation

## 2019-12-20 LAB — BASIC METABOLIC PANEL
Anion gap: 11 (ref 5–15)
BUN: 10 mg/dL (ref 6–20)
CO2: 25 mmol/L (ref 22–32)
Calcium: 9.4 mg/dL (ref 8.9–10.3)
Chloride: 97 mmol/L — ABNORMAL LOW (ref 98–111)
Creatinine, Ser: 0.63 mg/dL (ref 0.44–1.00)
GFR calc Af Amer: >60 mL/min (ref >60)
GFR calc non Af Amer: >60 mL/min (ref >60)
Glucose, Bld: 90 mg/dL (ref 70–99)
Potassium: 3.9 mmol/L (ref 3.5–5.1)
Sodium: 133 mmol/L — ABNORMAL LOW (ref 135–145)

## 2019-12-20 MED ORDER — FARXIGA 10 MG PO TABS: 10.0000 mg | ORAL_TABLET | Freq: Every day | ORAL | 6 refills | Status: DC

## 2019-12-20 MED ORDER — CARVEDILOL 12.5 MG PO TABS: 12.5000 mg | ORAL_TABLET | Freq: Two times a day (BID) | ORAL | 3 refills | Status: DC

## 2019-12-20 NOTE — Patient Instructions (Signed)
Stop Furosemide  Start Farxiga 10 mg daily  Increase Carvedilol to 12.5 mg Twice daily   Labs done today, your results will be available in MyChart, we will contact you for abnormal readings.  Your physician has requested that you have a cardiac MRI. Cardiac MRI uses a computer to create images of your heart as its beating, producing both still and moving pictures of your heart and major blood vessels. For further information please visit [email or web address]. Please follow the instruction sheet given to you today for more information.  [facility], we have to approve this with your insurance company and then we will call you to schedule  Please follow up with our heart failure pharmacist in 2-3 weeks with lab work  Your physician recommends that you schedule a follow-up appointment in: 6 weeks  If you have any questions or concerns before your next appointment please send [place] a message through [place] or call our office at [phone].  At the [facility], you and your health needs are our priority. As part of our continuing mission to provide you with exceptional heart care, we have created designated Provider Care Teams. These Care Teams include your primary Cardiologist (physician) and Advanced Practice Providers (APPs- Physician Assistants and Nurse Practitioners) who all work together to provide you with the care you need, when you need it.   You may see any of the following providers on your designated Care Team at your next follow up: Marland Kitchen Dr Arvilla Meres . Dr Marca Ancona . Tonye Becket, NP . Robbie Lis, PA . Karle Plumber, PharmD   Please be sure to bring in all your medications bottles to every appointment.

## 2019-12-22 NOTE — Progress Notes (Signed)
PCP: Merrilee Reynolds-Scocuzza, Nino Glow, MD HF Cardiology: Dr. Aundra Dubin  34 y.o. with history of HTN and CHF was referred by Ignacia Bayley for evaluation of CHF.  Patient was first diagnosed with CHF in 4/13, about 6 months after delivering her first and only child.  LV EF at that time was 20-25% by echo. With medical management, it had improved to 45-50% by 10/13.  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  She was doing ok after that until 9/20, when she developed COVID-19 infection.  She had respiratory symptoms but recovered after a week or so.  However, she does not feel like she got back to normal.  In 11/20, she began to notice very significant exertional dyspnea and orthopnea.  Echo was done in 11/20, showing EF back down to 20% with severe LV dilation, normal RV.  She was started on Lasix and subsequently Entresto, Coreg, and spironolactone.   Coronary CTA in 1/21 showed mid proximal-mid LAD stenosis with CAC 48.9 Agatston units (98th percentile).   She returns for followup of CHF. She has been generally feeling well.  No significant exertional dyspnea.  No orthopnea/PND.  No lightheadedness.  No chest pain. Weight is up 4 lbs.   Labs (2/20): creatinine 0.77  Labs (12/20): BNP 135, K 4, creatinine 0.82, LDL 152  PMH: 1. HTN 2. COVID-19 infection 9/20 3. Chronic systolic CHF: CHF diagnosed 4/13 about 6 months after delivering her 1st and only child.  Pregnancy complicated by poorly controlled HTN.  - 4/13 echo: EF 20-25% with diffuse hypokinesis.  - 10/13 echo: EF 45-50% - Echo (11/20): EF 20%, severe LV dilation, normal RV size and systolic function. - Coronary CTA (1/21): Mild proximal-mid LAD stenosis, CAD 48.9 Agatston units (98th percentile).  4. CAD:  Coronary CTA (1/21) with mild proximal-mid LAD stenosis, CAD 48.9 Agatston units (98th percentile).    SH: Lives in [place], 1 child, works as Medical illustrator, no ETOH, no drugs, no smoking.   FH: No cardiac disease that she knows  about.   ROS: All systems reviewed and negative except as per HPI.   Current Outpatient Medications  Medication Sig Dispense Refill  . albuterol (PROVENTIL HFA;VENTOLIN HFA) 108 (90 Base) MCG/ACT inhaler Inhale 1-2 puffs into the lungs every 6 (six) hours as needed for wheezing or shortness of breath. 1 Inhaler 12  . carvedilol (COREG) 12.5 MG tablet Take 1 tablet (12.5 mg total) by mouth 2 (two) times daily with a meal. 60 tablet 3  . cetirizine (ZYRTEC) 10 MG tablet Take 1 tablet (10 mg total) by mouth at bedtime as needed for allergies. 90 tablet 3  . Cholecalciferol 1.25 MG (50000 UT) capsule Take 1 capsule (50,000 Units total) by mouth once a week. 13 capsule 1  . rosuvastatin (CRESTOR) 10 MG tablet Take 1 tablet (10 mg total) by mouth daily. 30 tablet 5  . sacubitril-valsartan (ENTRESTO) 24-26 MG Take 1 tablet (24/26 mg) by mouth twice daily 60 tablet 6  . spironolactone (ALDACTONE) 25 MG tablet Take 1 tablet (25 mg total) by mouth daily. 90 tablet 3  . dapagliflozin propanediol (FARXIGA) 10 MG TABS tablet Take 10 mg by mouth daily before breakfast. 30 tablet 6   No current facility-administered medications for this encounter.    BP 109/75   Pulse 63   Wt 110.9 kg (244 lb 6.4 oz)   SpO2 97%   BMI 39.45 kg/m   General: NAD Neck: No JVD, no thyromegaly or thyroid nodule.  Lungs: Clear to auscultation bilaterally  with normal respiratory effort. CV: Nondisplaced PMI.  Heart regular S1/S2, no S3/S4, no murmur.  No peripheral edema.  No carotid bruit.  Normal pedal pulses.  Abdomen: Soft, nontender, no hepatosplenomegaly, no distention.  Skin: Intact without lesions or rashes.  Neurologic: Alert and oriented x 3.  Psych: Normal affect. Extremities: No clubbing or cyanosis.  HEENT: Normal.   Assessment/Plan: 1. Chronic systolic CHF: Diagnosed after delivery of child in 4/13, thought initially to be peri-partum cardiomyopathy.  EF improved from 20-25% to 45-50% by 10/13.  COVID-19  infection in 9/20, ongoing dyspnea prompted an echo, which showed EF 20% in 11/20.  Question if this is a new process, COVID-19 myocarditis?  She actually has age-advanced coronary disease on coronary CTA but no obstructive disease.  No family history of cardiomyopathy.  No substance abuse. She is not volume overloaded on exam, NYHA class II.  - Continue Entresto 24/26 bid.  - Continue spironolactone 25 daily.  - Increase Coreg to 12.5 mg bid.  - She can stop Lasix.  - Add dapagliflozin 10 mg daily.  - I will arrange for cardiac MRI to look for infiltrative disease/myocarditis.  Will do this in 3/21.  If LV EF < 35%, then will need to discuss ICD (not CRT candidate).   - BMET today and again in 2 wks.  2. HTN: BP is not elevated at this point.  3. Coronary artery disease: Non-obstructive disease on 1/21 CTA but age-advanced.   - She has started on Crestor 10 mg daily.  Lipids/LFTs in 2 months.   Followup in 6 wks with me  Patricia Reynolds 12/22/2019

## 2019-12-28 ENCOUNTER — Encounter (HOSPITAL_COMMUNITY): Payer: Self-pay

## 2020-01-02 NOTE — Progress Notes (Signed)
PCP: McLean-Scocuzza, Pasty Spillers, MD HF Cardiology: Dr. Shirlee Latch  HPI:  34 y.o. with history of HTN and CHF was referred by Ward Givens for evaluation of CHF.  Patient was first diagnosed with CHF in 4/13, about 6 months after delivering her first and only child.  LV EF at that time was 20-25% by echo. With medical management, it had improved to 45-50% by 10/13.  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  She was doing ok after that until 9/20, when she developed COVID-19 infection.  She had respiratory symptoms but recovered after a week or so.  However, she does not feel like she got back to normal.  In 11/20, she began to notice very significant exertional dyspnea and orthopnea.  Echo was done in 11/20, showing EF back down to 20% with severe LV dilation, normal RV.  She was started on Lasix and subsequently Entresto, Coreg, and spironolactone.   Coronary CTA in 1/21 showed mid proximal-mid LAD stenosis with CAC 48.9 Agatston units (98th percentile).   Recently presented to HF Clinic for follow up on 12/20/19. She reported generally feeling well.  No significant exertional dyspnea.  No orthopnea/PND.  No lightheadedness.  No chest pain. Weight was up 4 lbs.   Today she returns to HF clinic for pharmacist medication titration. At last visit with MD, carvedilol was increased to 12.5 mg BID, dapagliflozin 10 mg daily was initiated and furosemide was discontinued. Unfortunately she did not tolerate [place] and stopped taking the medication last week due to headaches and UTI infection. Additionally, she notes that her weight has been increasing since last visit (although she does not have a scale at home to provide specific amount of weight gain) and her hands and feet have been swelling. Weight up 3 lbs since last clinic visit. Her appetite is also poor as she is getting full quicker than usual. No dizziness, lightheadedness, chest pain or palpitations. Feels more fatigued since increasing carvedilol. No SOB/DOE  unless she goes upstairs. She has been following a low salt diet and practices fluid restriction.     HF Medications: Carvedilol 12.5 mg BID Entresto 24/26 mg BID Spironolactone 25 mg BID Dapagliflozin 10 mg daily  Has the patient been experiencing any side effects to the medications prescribed?  Yes - headaches and UTI since starting Farxiga. She self-discontinued [place] last week.   Does the patient have any problems obtaining medications due to transportation or finances?   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 Uses copay card for [facility].  Understanding of regimen: good Understanding of indications: good Potential of compliance: good Patient understands to avoid NSAIDs. Patient understands to avoid decongestants.    Pertinent Lab Values: . Serum creatinine 0.83, BUN 14, Potassium 4.1, Sodium 137  Vital Signs: . Weight: 247.2 lbs (last clinic weight: 244.4 lbs) . Blood pressure: 118/82  . Heart rate: 57   Assessment: 1. Chronic systolic CHF: Diagnosed after delivery of child in 4/13, thought initially to be peri-partum cardiomyopathy.  EF improved from 20-25% to 45-50% by 10/13.  COVID-19 infection in 9/20, ongoing dyspnea prompted an echo, which showed EF 20% in 11/20.  Question if this is a new process, COVID-19 myocarditis?  She actually has age-advanced coronary disease on coronary CTA but no obstructive disease.  No family history of cardiomyopathy.  No substance abuse.  -She is mildly volume overloaded on exam, NYHA class II.  - Labs: Scr 0.83, K 4.1 - Vitals: BP 118/82, HR 57 - Take furosemide 40 mg x3 days, then  decrease to 20 mg daily. Repeat BMET in 3 weeks.  - Continue carvedilol 12.5 mg BID. - Increase Entresto to 49/51 mg BID.  Repeat BMET in 3 weeks.   - Continue spironolactone 25 daily.  - Did not tolerate dapagliflozin due to headaches and UTI (self-discontinued).  -Scheduled for cardiac MRI to look for infiltrative disease/myocarditis.  Will do this on  02/01/20.  If LV EF < 35%, then will need to discuss ICD (not CRT candidate).  .  2. HTN: BP is not elevated at this point.  3. Coronary artery disease: Non-obstructive disease on 1/21 CTA but age-advanced.   - She has started on Crestor 10 mg daily.  Lipids/LFTs in 2 months.    Plan: 1) Medication changes: Based on clinical presentation, vital signs and recent labs will have her take furosemide 40 mg daily x3 days, then take furosemide 20 mg daily. Increase Entresto to 49/51 mg BID. Repeat BMET 3 weeks. 2) Labs: Scr 0.83, K 4.1 3) Follow-up: 3 weeks in HF Clinic with Dr. Rush Farmer, PharmD, BCPS, [facility], [place] [facility] Pharmacist [phone]

## 2020-01-08 ENCOUNTER — Ambulatory Visit (HOSPITAL_COMMUNITY)
Admission: RE | Admit: 2020-01-08 | Discharge: 2020-01-08 | Disposition: A | Discharge status: Home / Self Care | Payer: BC Managed Care – PPO | Source: Ambulatory Visit | Attending: Cardiology | Admitting: Cardiology

## 2020-01-08 ENCOUNTER — Other Ambulatory Visit: Payer: Self-pay

## 2020-01-08 VITALS — BP 118/82 | HR 57 | Wt 247.2 lb

## 2020-01-08 DIAGNOSIS — I2510 Atherosclerotic heart disease of native coronary artery without angina pectoris: Secondary | ICD-10-CM | POA: Diagnosis not present

## 2020-01-08 DIAGNOSIS — I5022 Chronic systolic (congestive) heart failure: Secondary | ICD-10-CM | POA: Diagnosis present

## 2020-01-08 DIAGNOSIS — I110 Hypertensive heart disease with heart failure: Secondary | ICD-10-CM | POA: Insufficient documentation

## 2020-01-08 DIAGNOSIS — Z79899 Other long term (current) drug therapy: Secondary | ICD-10-CM | POA: Diagnosis not present

## 2020-01-08 LAB — BASIC METABOLIC PANEL
Anion gap: 8 (ref 5–15)
BUN: 14 mg/dL (ref 6–20)
CO2: 28 mmol/L (ref 22–32)
Calcium: 9.5 mg/dL (ref 8.9–10.3)
Chloride: 101 mmol/L (ref 98–111)
Creatinine, Ser: 0.83 mg/dL (ref 0.44–1.00)
GFR calc Af Amer: >60 mL/min (ref >60)
GFR calc non Af Amer: >60 mL/min (ref >60)
Glucose, Bld: 85 mg/dL (ref 70–99)
Potassium: 4.1 mmol/L (ref 3.5–5.1)
Sodium: 137 mmol/L (ref 135–145)

## 2020-01-08 MED ORDER — FUROSEMIDE 40 MG PO TABS: 20.0000 mg | ORAL_TABLET | Freq: Every day | ORAL | 6 refills | Status: DC

## 2020-01-08 MED ORDER — SACUBITRIL-VALSARTAN 49-51 MG PO TABS: 1.0000 | ORAL_TABLET | Freq: Two times a day (BID) | ORAL | 11 refills | Status: DC

## 2020-01-08 NOTE — Patient Instructions (Addendum)
It was a pleasure seeing you today!  MEDICATIONS: -We are changing your medications today -Take furosemide 40 mg (1 tablet) daily for 3 days, then decrease to 20 mg (1/2 tablet) daily. -Increase Entresto to 49/51 mg (1 tablet) twice daily. You may take 2 tablets of the 24/26 mg strength twice daily until you can pick up the new prescription.  -Call if you have questions about your medications.  LABS: -We will call you if your labs need attention.  NEXT APPOINTMENT: Return to clinic in 3 weeks with Dr. Shirlee Latch.  In general, to take care of your heart failure: -Limit your fluid intake to 2 Liters (half-gallon) per day.   -Limit your salt intake to ideally 2-3 grams (2000-3000 mg) per day. -Weigh yourself daily and record, and bring that "weight diary" to your next appointment.  (Weight gain of 2-3 pounds in 1 day typically means fluid weight.) -The medications for your heart are to help your heart and help you live longer.   -Please contact us before stopping any of your heart medications.  Call the clinic at [phone] with questions or to reschedule future appointments.

## 2020-01-10 ENCOUNTER — Encounter (HOSPITAL_COMMUNITY): Payer: Self-pay

## 2020-01-30 ENCOUNTER — Encounter (HOSPITAL_COMMUNITY): Payer: Self-pay

## 2020-01-31 ENCOUNTER — Encounter (HOSPITAL_COMMUNITY): Payer: BC Managed Care – PPO | Admitting: Cardiology

## 2020-01-31 ENCOUNTER — Telehealth (HOSPITAL_COMMUNITY): Payer: Self-pay | Admitting: Emergency Medicine

## 2020-01-31 DIAGNOSIS — I428 Other cardiomyopathies: Secondary | ICD-10-CM

## 2020-01-31 MED ORDER — ALPRAZOLAM 0.25 MG PO TABS: ORAL_TABLET | ORAL | 0 refills | Status: DC

## 2020-01-31 NOTE — Telephone Encounter (Signed)
Returning phone call after patient left a mychart message response to her cardiac MRI instructions.  States that her appt for cMR was rescheduled a while back and she had lost the original prescription for anti anxiety medication that mclean had prescribed. Obtained verbal order to re-order medication for patients appt tomorrow.   Pt appreciative of the call.  Rockwell Alexandria RN Navigator Cardiac Imaging [facility] [phone] Office  [phone] Cell

## 2020-02-01 ENCOUNTER — Ambulatory Visit (HOSPITAL_COMMUNITY): Admission: RE | Admit: 2020-02-01 | Payer: BC Managed Care – PPO | Source: Ambulatory Visit

## 2020-02-01 ENCOUNTER — Encounter (HOSPITAL_COMMUNITY): Payer: Self-pay

## 2020-02-04 ENCOUNTER — Other Ambulatory Visit: Payer: Self-pay | Admitting: Cardiovascular Disease

## 2020-02-04 ENCOUNTER — Ambulatory Visit: Payer: BC Managed Care – PPO | Admitting: Cardiovascular Disease

## 2020-02-04 DIAGNOSIS — I5022 Chronic systolic (congestive) heart failure: Secondary | ICD-10-CM

## 2020-02-04 NOTE — Telephone Encounter (Signed)
Our office received a refill request for Dr. Alford Highland pt and I'm not sure what your pool for refill request is so I forwarded to you. I'm not sure if you could address this matter or forward to correct pool department @ CHF clinic.  Thank you, Jearld Adjutant

## 2020-02-04 NOTE — Telephone Encounter (Signed)
Ok thanks. I will take care of it 

## 2020-02-04 NOTE — Telephone Encounter (Signed)
Please advise if ok to refill spironolactone 25 mg tablet qd.

## 2020-02-08 ENCOUNTER — Encounter (HOSPITAL_COMMUNITY): Payer: BC Managed Care – PPO | Admitting: Cardiology

## 2020-02-08 ENCOUNTER — Ambulatory Visit: Payer: BC Managed Care – PPO | Admitting: Internal Medicine

## 2020-02-14 ENCOUNTER — Other Ambulatory Visit (HOSPITAL_COMMUNITY): Payer: BC Managed Care – PPO

## 2020-02-14 ENCOUNTER — Encounter: Payer: Self-pay | Admitting: Internal Medicine

## 2020-04-02 MED ORDER — FUROSEMIDE 40 MG PO TABS: 20.0000 mg | ORAL_TABLET | Freq: Every day | ORAL | 6 refills | Status: DC

## 2020-04-18 ENCOUNTER — Telehealth: Payer: Self-pay | Admitting: Internal Medicine

## 2020-04-18 NOTE — Telephone Encounter (Signed)
lvm to reschedule appointment from 3/19

## 2020-10-02 ENCOUNTER — Encounter: Payer: Self-pay | Admitting: Nurse Practitioner

## 2020-10-02 ENCOUNTER — Ambulatory Visit: Payer: BC Managed Care – PPO | Admitting: Nurse Practitioner

## 2020-10-02 ENCOUNTER — Other Ambulatory Visit: Payer: Self-pay

## 2020-10-02 ENCOUNTER — Telehealth: Payer: Self-pay | Admitting: Nurse Practitioner

## 2020-10-02 VITALS — BP 140/82 | HR 79 | Ht 66.0 in | Wt 261.1 lb

## 2020-10-02 DIAGNOSIS — I2510 Atherosclerotic heart disease of native coronary artery without angina pectoris: Secondary | ICD-10-CM

## 2020-10-02 DIAGNOSIS — I428 Other cardiomyopathies: Secondary | ICD-10-CM | POA: Diagnosis not present

## 2020-10-02 DIAGNOSIS — I5022 Chronic systolic (congestive) heart failure: Secondary | ICD-10-CM | POA: Diagnosis not present

## 2020-10-02 DIAGNOSIS — E782 Mixed hyperlipidemia: Secondary | ICD-10-CM

## 2020-10-02 MED ORDER — ALPRAZOLAM 0.25 MG PO TABS: 0.2500 mg | ORAL_TABLET | Freq: Once | ORAL | 0 refills | Status: DC | PRN

## 2020-10-02 MED ORDER — CARVEDILOL 12.5 MG PO TABS: 12.5000 mg | ORAL_TABLET | Freq: Two times a day (BID) | ORAL | 1 refills | Status: DC

## 2020-10-02 NOTE — Patient Instructions (Signed)
Medication Instructions:  Your physician recommends that you continue on your current medications as directed. Please refer to the Current Medication list given to you today.  Prescription for Xanax 0.25 mg by mouth to take on call to MRI will be sent to pharmacy by the provider.  *If you need a refill on your cardiac medications before your next appointment, please call your pharmacy*  Lab Work: Your physician recommends that you return for lab work in: TODAY - CBC, CMP, LIPID, DIRECT LDL.  If you have labs (blood work) drawn today and your tests are completely normal, you will receive your results only by: Marland Kitchen MyChart Message (if you have MyChart) OR . A paper copy in the mail If you have any lab test that is abnormal or we need to change your treatment, we will call you to review the results.  Testing/Procedures:  Your physician has requested that you have a cardiac MRI. Cardiac MRI uses a computer to create images of your heart as its beating, producing both still and moving pictures of your heart and major blood vessels.  Someone will call to schedule. If you do not receive a call in the next 1-2 weeks, please call our office to let us know.  Follow-Up: At [facility], you and your health needs are our priority.  As part of our continuing mission to provide you with exceptional heart care, we have created designated Provider Care Teams.  These Care Teams include your primary Cardiologist (physician) and Advanced Practice Providers (APPs -  Physician Assistants and Nurse Practitioners) who all work together to provide you with the care you need, when you need it.  We recommend signing up for the patient portal called "MyChart".  Sign up information is provided on this After Visit Summary.  MyChart is used to connect with patients for Virtual Visits (Telemedicine).  Patients are able to view lab/test results, encounter notes, upcoming appointments, etc.  Non-urgent messages can be sent to  your provider as well.   To learn more about what you can do with MyChart, go to [email or web address].    Your next appointment:   3 month(s)  The format for your next appointment:   In Person  Provider:   You may see Julien Nordmann, MD or one of the following Advanced Practice Providers on your designated Care Team:    Nicolasa Ducking, NP  Eula Listen, PA-C  Marisue Ivan, PA-C  Cadence Fransico Michael, [place]    Magnetic Resonance Imaging Magnetic resonance imaging (MRI) is a painless test that takes pictures of the inside of your body. This test uses a strong magnet. This test does not use X-rays. What happens before the procedure?  You will be asked about any metal you may have in your body. This includes: ? Any joint replacement (prosthesis), such as an artificial knee or hip. ? Any implanted devices or ports. ? A metallic ear implant (cochlear implant). ? An artificial heart valve. ? A metallic object in the eye. ? Metal splinters. ? Bullet fragments. ? Any tattoos. Some of the darker inks can cause problems with testing.  You will be asked to take off all metal. This includes: ? Your watch, jewelry, and other metal objects. ? Hearing aids. ? Dentures. ? Underwire bra. ? Makeup. Some kinds of makeup contain small amounts of metal. ? Braces and fillings normally are not a problem.  If you are breastfeeding, ask your doctor if you need to pump before your test and then stop breastfeeding  for a short time. You may need to do this if dye (contrast material) will be used during your MRI.  If you have a fear of cramped spaces (claustrophobia), you may be given medicines prior to the MRI. What happens during the procedure?   You may be given earplugs or headphones to listen to music. The MRI machine can be noisy.  You will lie down on a platform. It looks like a long table.  If dye will be used, an IV tube will be placed into one of your veins. Dye will be given through  your IV tube at a certain time as images are taken.  The platform will slide into a tunnel. The tunnel has magnets inside of it. When you are inside the tunnel, you will still be able to talk to your doctor.  The tunnel will scan your body and make images. You will be asked to lie very still. Your doctor will tell you when you can move. You may have to wait a few minutes to make sure that the images from the test are clear.  When all images are taken, the platform will slide out of the tunnel. The procedure may vary among doctors and hospitals. What happens after the procedure?  You may be taken to a recovery area if sedation medicines were used. Your blood pressure, heart rate, breathing rate, and blood oxygen level will be monitored until you leave the hospital or clinic.  If dye was used: ? It will leave your body through your pee (urine). It takes about a day for all of the dye to leave the body. ? You may be told to drink plenty of fluids. This helps your body get rid of the dye. ? If you are breastfeeding, do not breastfeed your child until your doctor says that this is safe.  You may go back to your normal activities right away, or as told by your doctor.  It is up to you to get your test results. Ask your doctor, or the department that is doing the test, when your results will be ready. Summary  Magnetic resonance imaging (MRI) is a test that takes pictures of the inside of your body.  Before your MRI, be sure to tell your doctor about any metal you may have in your body.  You may go back to your normal activities right away, or as told by your doctor. This information is not intended to replace advice given to you by your health care provider. Make sure you discuss any questions you have with your health care provider. Document Revised: 10/03/2017 Document Reviewed: 10/03/2017 Elsevier Patient Education  2020 [employer].

## 2020-10-02 NOTE — Telephone Encounter (Signed)
Left message for patient to call to discuss scheduling the Cardiac MRI that has been ordered. °

## 2020-10-02 NOTE — Progress Notes (Signed)
Office Visit    Patient Name: Patricia Reynolds Date of Encounter: 10/02/2020  Primary Care Provider:  McLean-Scocuzza, Pasty Spillers, MD Primary Cardiologist:  Julien Nordmann, MD  Chief Complaint    34 year old female with a history of hypertensive nonischemic cardiomyopathy, chronic, systolic diastolic congestive heart failure, and COVID-19 infection (September 2020), who presents for follow-up of heart failure.  Past Medical History    Past Medical History:  Diagnosis Date  . Bronchitis    11/2017   . Chicken pox   . Chronic combined systolic (congestive) and diastolic (congestive) heart failure (HCC) 03/14/2012   a. 02/2012 Echo: EF 20-25%, diff HK, Gr2 DD; b. 08/2012 Echo: EF 45-5%, mild LVH; c. 09/2019 Echo: EF 20%, sev dil LV, glob HK. Nl RV fxn. Mod dil LA.  Marland Kitchen COVID-19 virus infection 07/2019  . Hypertensive heart disease   . Hypertensive NICM (nonischemic cardiomyopathy) (HCC)    a. 02/2012 Echo: EF 20-25%, diff HK, Gr2 DD; b. 03/2012 ETT: Ex time 6:11, max HR 171, No ST/T changes; c. 08/2012 Echo: EF 45-50%, mild LVH; d. 09/2019 Echo: EF 20%, sev dil LV, glob HK. Nl RV fxn. Mod dil LA.  . Maternal anemia complicating pregnancy, childbirth, or the puerperium 09/22/2011  . Morbid obesity (HCC)   . Nonobstructive CAD (coronary artery disease)    a. 11/2019 Cor CTA: Cor Ca2+ 48.9 (98th%'ile). LM nl, LAD nl, LCX nl, RCA <50% prox/mid mixed plaque.   Past Surgical History:  Procedure Laterality Date  . CESAREAN SECTION  09/21/2011   Procedure: CESAREAN SECTION;  Surgeon: Roseanna Rainbow, MD;  Location: [facility];  Service: Gynecology;  Laterality: N/A;  . FINGER SURGERY     right thumb repair 2006 playing Basketball     Allergies  Allergies  Allergen Reactions  . Darvocet [Propoxyphene N-Acetaminophen] Nausea And Vomiting    History of Present Illness    34 year old female the above past medical history including hypertensive heart disease, hypertensive nonischemic  cardiomyopathy, chronic diastolic diastolic congestive heart failure, and COVID-19 infection in September 2020.  She was initially diagnosed with nonischemic cardiomyopathy in April 2013, when she was hospitalized for dyspnea approximately 6 months after her first and only pregnancy.  She was found to have LV dysfunction with an EF of 25% global hypokinesis.  At the time, she had poorly controlled hypertension.  She was placed on medical therapy including beta-blocker and ACE inhibitor and exercise treadmill was performed showing reasonable exercise tolerance (6: 11), without any ST or T changes.  It was felt that her cardiomyopathy was nonischemic in nature and secondary to hypertension.  Follow-up echocardiography in October 2013 revealed improvement in LV function to 45-50%.  She was lost to follow-up for approximately 4 years but then reestablish care with Dr. Mariah Milling in June 2019.  In September 2020, she was diagnosed with COVID-19 but did not require hospitalization.  She did have persistent cough throughout October and then in November 2020, she began to experience dyspnea on exertion with ongoing cough followed by orthopnea.  She was seen at [facility] October 04, 2019, where chest x-ray showed mild pulmonary edema and cardiomegaly.  A follow-up echocardiogram showed an EF of 20% with severely dilated left ventricle, and global hypokinesis.  RV function was normal.  Due to nagging cough, we switched her from lisinopril to losartan with complete resolution of cough.  Losartan was later switched to [facility] and she was otherwise maintained on carvedilol and spironolactone.  She was referred to  advanced heart failure team in [place] underwent coronary CTA in January 2021 showing mild proximal to mid LAD stenosis with a coronary calcium score of 48.9 (90th percentile).  Dapagliflozin therapy was added but later discontinued as she did not tolerate.  She was to be scheduled for cardiac MRI but was concerned  about claustrophobia and for some reason was unable to get Xanax filled and therefore canceled the MRI.  Our notes indicate Xanax was sent in for her.  Despite being lost to follow-up, she notes that she has been feeling exceptionally well.  She reports compliance with her medications but is not weighing daily.  Her weight is up 25 pounds over the past year however, she has not been having any swelling, dyspnea, PND, or orthopnea.  Further, she denies chest pain, palpitations, dizziness, syncope, or early satiety.  She is disappointed by her weight gain and says that she has been trying to walk again recently, now that the weather is cooler.  She says she is motivated to lose weight.  She is willing to reschedule cardiac MRI as long as she can get Xanax to be taken on call.  Home Medications    Prior to Admission medications   Medication Sig Start Date End Date Taking? Authorizing Provider  albuterol (PROVENTIL HFA;VENTOLIN HFA) 108 (90 Base) MCG/ACT inhaler Inhale 1-2 puffs into the lungs every 6 (six) hours as needed for wheezing or shortness of breath. 03/08/19   McLean-Scocuzza, Pasty Spillers, MD  ALPRAZolam Prudy Feeler) 0.25 MG tablet One time dose. Take 1 tab 30 mins prior to cardiac MRI on 02/01/20 at 7:30a 01/31/20   Laurey Morale, MD  carvedilol (COREG) 12.5 MG tablet Take 1 tablet (12.5 mg total) by mouth 2 (two) times daily with a meal. 12/20/19   Laurey Morale, MD  cetirizine (ZYRTEC) 10 MG tablet Take 1 tablet (10 mg total) by mouth at bedtime as needed for allergies. 03/08/19   McLean-Scocuzza, Pasty Spillers, MD  Cholecalciferol 1.25 MG (50000 UT) capsule Take 1 capsule (50,000 Units total) by mouth once a week. 11/09/19   McLean-Scocuzza, Pasty Spillers, MD  furosemide (LASIX) 40 MG tablet Take 0.5 tablets (20 mg total) by mouth daily. 04/02/20   Creig Hines, NP  rosuvastatin (CRESTOR) 10 MG tablet Take 1 tablet (10 mg total) by mouth daily. 12/19/19   Laurey Morale, MD  sacubitril-valsartan  (ENTRESTO) 49-51 MG Take 1 tablet by mouth 2 (two) times daily. 01/08/20   Laurey Morale, MD  spironolactone (ALDACTONE) 25 MG tablet TAKE 1 TABLET BY MOUTH ONCE DAILY. 02/04/20   Laurey Morale, MD    Review of Systems    She denies chest pain, palpitations, dyspnea, pnd, orthopnea, n, v, dizziness, syncope, edema, weight gain, or early satiety.  All other systems reviewed and are otherwise negative except as noted above.  Physical Exam    VS:  BP 140/82 (BP Location: Left Arm, Patient Position: Sitting, Cuff Size: Normal)   Pulse 79   Ht 5\' 6"  (1.676 m)   Wt 261 lb 2 oz (118.4 kg)   SpO2 99%   BMI 42.15 kg/m  , BMI Body mass index is 42.15 kg/m. GEN: Obese, in no acute distress. HEENT: normal. Neck: Supple, obese, difficult to gauge JVP, no carotid bruits, or masses. Cardiac: RRR, no murmurs, rubs, or gallops. No clubbing, cyanosis, edema.  Radials/PT 2+ and equal bilaterally.  Respiratory:  Respirations regular and unlabored, clear to auscultation bilaterally. GI: Obese, soft, nontender, nondistended, BS +  x 4. MS: no deformity or atrophy. Skin: warm and dry, no rash. Neuro:  Strength and sensation are intact. Psych: Normal affect.  Accessory Clinical Findings    ECG personally reviewed by me today -sinus arrhythmia, 79, PVC, poor R wave progression - no acute changes.  Lab Results  Component Value Date   WBC 7.6 01/03/2018   HGB 12.6 01/03/2018   HCT 38.8 01/03/2018   MCV 81.0 01/03/2018   PLT 364.0 01/03/2018   Lab Results  Component Value Date   CREATININE 0.83 01/08/2020   BUN 14 01/08/2020   NA 137 01/08/2020   K 4.1 01/08/2020   CL 101 01/08/2020   CO2 28 01/08/2020   Lab Results  Component Value Date   ALT 9 01/03/2018   AST 10 01/03/2018   ALKPHOS 53 01/03/2018   BILITOT 0.9 01/03/2018   Lab Results  Component Value Date   CHOL 217 (H) 11/08/2019   HDL 47 11/08/2019   LDLCALC 152 (H) 11/08/2019   TRIG 103 11/08/2019   CHOLHDL 4.6 (H)  11/08/2019    Assessment & Plan    1.  Chronic heart failure with reduced ejection fraction/nonischemic cardiomyopathy: Initially diagnosed with nonischemic cardiomyopathy/hypertensive cardiomyopathy in April 2013 with subsequent provement LV function of October 2013 on medical therapy.  EF was 45-50% at that time.  She did well for several years but developed COVID-19 infection September 2020 with subsequent development of persistent cough, dyspnea, orthopnea and finding of pulmonary edema on chest x-ray.  Follow-up echo in November 2020 showed an EF of 20% with global hypokinesis and normal RV function.  She improved significantly with diuretic therapy with subsequent resolution of cough when switching from lisinopril to losartan.  This was later switched to [facility] which has been titrated to 49-51 mg twice daily.  She was also treated with carvedilol (ran out of yesterday), spironolactone, and Lasix 20 mg daily.  She was referred to advanced heart failure clinic in [place] last December.  Coronary CT angiography earlier this year did not show any significant disease (mild RCA stenosis) and she was scheduled for cardiac MRI but she eventually canceled due to concern about claustrophobia and difficulty obtaining a prescription for Xanax.  She was tried on [place] but did not tolerate this due to headache and extremity swelling.  Since her last heart failure visit, she notes that she has done remarkably well.  She is not experiencing any dyspnea or edema.  Her weight is up 25 pounds over the past year, which she attributes to excess calories and reduced activity.  She is euvolemic on examination.  Blood pressure is elevated today though she did not take any of her medications yet.  In that setting, I will not make any adjustments to her medications but did recommend that she follow her blood pressure at home, as there may be room to titrate Entresto to max dose.  Further, she has not been weighing daily and  I recommended that she do this and we discussed parameters for notification/additional diuretic therapy.  She is willing to reschedule cardiac MRI and I will send in a prescription for Xanax 0.25 mg x 1 to be taken on call.  Pending MRI, we can consider an alternate SGLT2 inhibitor (Jardiance) and referral back to advanced heart failure count clinic +/- EP referral if EF remains significantly depressed.  We discussed the importance of daily weights, sodium restriction, medication compliance, and symptom reporting and she verbalizes understanding.  Follow-up CBC, complete metabolic panel  today.  2.  Hypertensive heart disease: Blood pressure elevated today however, as noted above, she has yet to take her usual medications including beta-blocker, Entresto, spironolactone, and Lasix.  3.  Nonobstructive CAD: Coronary CT angiography earlier this year with mild, nonobstructive (less than 50%) RCA disease.  She has been on statin therapy with an LDL of 152 last December (pre-statin).  Follow-up lipids with direct LDL (not fasting) and LFTs today.  4.  Hyperlipidemia: Follow-up labs today.  Continue statin therapy.  5.  Morbid obesity: Up 25 pounds over the past year in the setting of inactivity and increased caloric intake.  We discussed the importance of regular exercise and weight monitoring.  She says she is motivated to lose weight and is disappointed by weight gain.  6.  Disposition: Follow-up lab work today.  Follow-up cardiac MRI.  We will arrange for follow-up here in 3 months pending cardiac MRI may need advanced heart failure follow-up and or EP follow-up sooner.  Nicolasa Ducking, NP 10/02/2020, 12:05 PM

## 2020-10-03 ENCOUNTER — Telehealth: Payer: Self-pay | Admitting: Nurse Practitioner

## 2020-10-03 DIAGNOSIS — E782 Mixed hyperlipidemia: Secondary | ICD-10-CM

## 2020-10-03 LAB — COMPREHENSIVE METABOLIC PANEL
ALT: 11 IU/L (ref 0–32)
AST: 14 IU/L (ref 0–40)
Albumin/Globulin Ratio: 1.5 (ref 1.2–2.2)
Albumin: 4.1 g/dL (ref 3.8–4.8)
Alkaline Phosphatase: 60 IU/L (ref 44–121)
BUN/Creatinine Ratio: 18 (ref 9–23)
BUN: 14 mg/dL (ref 6–20)
Bilirubin Total: 0.6 mg/dL (ref 0.0–1.2)
CO2: 23 mmol/L (ref 20–29)
Calcium: 9.6 mg/dL (ref 8.7–10.2)
Chloride: 103 mmol/L (ref 96–106)
Creatinine, Ser: 0.79 mg/dL (ref 0.57–1.00)
GFR calc Af Amer: 114 mL/min/{1.73_m2} (ref >59)
GFR calc non Af Amer: 99 mL/min/{1.73_m2} (ref >59)
Globulin, Total: 2.7 g/dL (ref 1.5–4.5)
Glucose: 102 mg/dL — ABNORMAL HIGH (ref 65–99)
Potassium: 4.3 mmol/L (ref 3.5–5.2)
Sodium: 141 mmol/L (ref 134–144)
Total Protein: 6.8 g/dL (ref 6.0–8.5)

## 2020-10-03 LAB — LIPID PANEL
Chol/HDL Ratio: 4.4 ratio (ref 0.0–4.4)
Cholesterol, Total: 209 mg/dL — ABNORMAL HIGH (ref 100–199)
HDL: 47 mg/dL (ref >39)
LDL Chol Calc (NIH): 145 mg/dL — ABNORMAL HIGH (ref 0–99)
Triglycerides: 96 mg/dL (ref 0–149)
VLDL Cholesterol Cal: 17 mg/dL (ref 5–40)

## 2020-10-03 LAB — CBC
Hematocrit: 38.5 % (ref 34.0–46.6)
Hemoglobin: 12.8 g/dL (ref 11.1–15.9)
MCH: 27.2 pg (ref 26.6–33.0)
MCHC: 33.2 g/dL (ref 31.5–35.7)
MCV: 82 fL (ref 79–97)
Platelets: 337 10*3/uL (ref 150–450)
RBC: 4.71 x10E6/uL (ref 3.77–5.28)
RDW: 12.3 % (ref 11.7–15.4)
WBC: 6.3 10*3/uL (ref 3.4–10.8)

## 2020-10-03 LAB — LDL CHOLESTEROL, DIRECT: LDL Direct: 144 mg/dL — ABNORMAL HIGH (ref 0–99)

## 2020-10-03 NOTE — Telephone Encounter (Signed)
I spoke with the patient regarding her lab results. I did verify with the patient that she ran out of refills on her crestor 10 mg tablets and has been out of this for ~ 3 weeks. She picked up a refill today and took a dose this morning.  I have advised her I will notify Ward Givens that we have spoken and I will call her back with any further recommendations.  The patient voices understanding and is agreeable.

## 2020-10-03 NOTE — Telephone Encounter (Signed)
Attempted to call the patient. No answer- I left a message to please call back.  

## 2020-10-03 NOTE — Telephone Encounter (Signed)
Creig Hines, NP  10/03/2020 11:43 AM EST     Kidney fxn, lytes, blood counts nl. Cholesterol is elevated (209) w/ an LDL of 145. This looks fairly similar to lipids prior to being rx rosuvastatin. Has she been taking/missing doses maybe?

## 2020-10-03 NOTE — Telephone Encounter (Signed)
Thanks.  She should resume crestor as Rx and plan on f/u lipids/lfts in 6 wks.  I've placed the orders for [facility].

## 2020-10-03 NOTE — Telephone Encounter (Signed)
Left message for patient to call to discuss scheduling the Cardiac MRI that has been ordered. °

## 2020-10-07 ENCOUNTER — Encounter: Payer: Self-pay | Admitting: Nurse Practitioner

## 2020-10-07 DIAGNOSIS — Z79899 Other long term (current) drug therapy: Secondary | ICD-10-CM

## 2020-10-07 DIAGNOSIS — E782 Mixed hyperlipidemia: Secondary | ICD-10-CM

## 2020-10-07 DIAGNOSIS — I2510 Atherosclerotic heart disease of native coronary artery without angina pectoris: Secondary | ICD-10-CM

## 2020-10-07 MED ORDER — ROSUVASTATIN CALCIUM 10 MG PO TABS: 10.0000 mg | ORAL_TABLET | Freq: Every day | ORAL | 5 refills | Status: DC

## 2020-10-07 NOTE — Telephone Encounter (Signed)
No answer. Left detailed message with recommendations to resume Crestor and get repeat fasting lab work in 6 weeks at [facility], ok per DPR, and to call back if any questions.  Message sent to MyChart as well.

## 2020-10-07 NOTE — Telephone Encounter (Signed)
Left message for patient regarding appointment for Cardiac MRI scheduled Friday 10/31/20 at 7:00 am at [facility]---arrival time is 6:30 am---1st floor admissions office.  Will mail information to patient and it is also available in My Chart.  Requested patient call with questions or concerns.

## 2020-10-28 NOTE — Telephone Encounter (Signed)
Spoke to pharmacist at [facility]  They never received the prescription for the Xanax tablet. I was able to give a verbal order as Xanax- Take 1 tablet (0.25 mg total) by mouth once as needed for up to 1 dose (Approx 1 hour prior to MRI). No refills. 1 tablet.

## 2020-10-29 ENCOUNTER — Telehealth (HOSPITAL_COMMUNITY): Payer: Self-pay | Admitting: *Deleted

## 2020-10-29 NOTE — Telephone Encounter (Signed)
Reaching out to patient to offer assistance regarding upcoming cardiac imaging study; pt verbalizes understanding of appt date/time, parking situation and where to check in, medications ordered, and verified current allergies; name and call back number provided for further questions should they arise  Claryce Friel Tai RN Navigator Cardiac Imaging [facility] [facility] [phone] office [phone] cell  

## 2020-10-31 ENCOUNTER — Ambulatory Visit (HOSPITAL_COMMUNITY)
Admission: RE | Admit: 2020-10-31 | Discharge: 2020-10-31 | Disposition: A | Discharge status: Home / Self Care | Payer: BC Managed Care – PPO | Source: Ambulatory Visit | Attending: Cardiology | Admitting: Cardiology

## 2020-10-31 ENCOUNTER — Other Ambulatory Visit: Payer: Self-pay

## 2020-10-31 DIAGNOSIS — I5022 Chronic systolic (congestive) heart failure: Secondary | ICD-10-CM

## 2020-10-31 MED ORDER — GADOBUTROL 1 MMOL/ML IV SOLN
10.0000 mL | Freq: Once | INTRAVENOUS | Status: AC | PRN
  Administered 2020-10-31: 10 mL via INTRAVENOUS

## 2020-11-06 ENCOUNTER — Ambulatory Visit (HOSPITAL_COMMUNITY)
Admission: RE | Admit: 2020-11-06 | Discharge: 2020-11-06 | Disposition: A | Discharge status: Home / Self Care | Payer: BC Managed Care – PPO | Source: Ambulatory Visit | Attending: Cardiology | Admitting: Cardiology

## 2020-11-06 ENCOUNTER — Encounter (HOSPITAL_COMMUNITY): Payer: Self-pay | Admitting: Cardiology

## 2020-11-06 ENCOUNTER — Other Ambulatory Visit: Payer: Self-pay

## 2020-11-06 VITALS — BP 124/80 | HR 72 | Wt 254.6 lb

## 2020-11-06 DIAGNOSIS — I5022 Chronic systolic (congestive) heart failure: Secondary | ICD-10-CM | POA: Diagnosis not present

## 2020-11-06 DIAGNOSIS — Z6841 Body Mass Index (BMI) 40.0 and over, adult: Secondary | ICD-10-CM | POA: Diagnosis not present

## 2020-11-06 DIAGNOSIS — I110 Hypertensive heart disease with heart failure: Secondary | ICD-10-CM | POA: Insufficient documentation

## 2020-11-06 DIAGNOSIS — Z79899 Other long term (current) drug therapy: Secondary | ICD-10-CM | POA: Insufficient documentation

## 2020-11-06 DIAGNOSIS — I2510 Atherosclerotic heart disease of native coronary artery without angina pectoris: Secondary | ICD-10-CM | POA: Diagnosis not present

## 2020-11-06 DIAGNOSIS — E669 Obesity, unspecified: Secondary | ICD-10-CM | POA: Diagnosis not present

## 2020-11-06 DIAGNOSIS — Z8616 Personal history of COVID-19: Secondary | ICD-10-CM | POA: Insufficient documentation

## 2020-11-06 LAB — BASIC METABOLIC PANEL
Anion gap: 13 (ref 5–15)
BUN: 13 mg/dL (ref 6–20)
CO2: 25 mmol/L (ref 22–32)
Calcium: 9.8 mg/dL (ref 8.9–10.3)
Chloride: 98 mmol/L (ref 98–111)
Creatinine, Ser: 0.78 mg/dL (ref 0.44–1.00)
GFR, Estimated: >60 mL/min (ref >60)
Glucose, Bld: 85 mg/dL (ref 70–99)
Potassium: 4 mmol/L (ref 3.5–5.1)
Sodium: 136 mmol/L (ref 135–145)

## 2020-11-06 MED ORDER — FUROSEMIDE 20 MG PO TABS: 20.0000 mg | ORAL_TABLET | ORAL | 3 refills | Status: DC

## 2020-11-06 MED ORDER — ENTRESTO 97-103 MG PO TABS: 1.0000 | ORAL_TABLET | Freq: Two times a day (BID) | ORAL | 6 refills | Status: DC

## 2020-11-06 MED ORDER — CARVEDILOL 12.5 MG PO TABS: 18.7500 mg | ORAL_TABLET | Freq: Two times a day (BID) | ORAL | 1 refills | Status: DC

## 2020-11-06 NOTE — Patient Instructions (Signed)
Increase Carvedilol to 18.75 mg (1 & 1/2 tab) Twice daily   Increase Entresto to 97/103 mg Twice daily   Decrease Furosemide to 20 mg every other day   Labs done today, your results will be available in MyChart, we will contact you for abnormal readings.  Your physician recommends that you return for lab work in: 10-14 days  You have been referred to [facility] for defbrillator placement, they will call you for an appointment  Please follow up with our heart failure pharmacist in 3-4 weeks  Your physician recommends that you schedule a follow-up appointment in: 6-8 weeks  If you have any questions or concerns before your next appointment please send [place] a message through [employer] or call our office at [phone].    TO LEAVE A MESSAGE FOR THE NURSE SELECT OPTION 2, PLEASE LEAVE A MESSAGE INCLUDING:  YOUR NAME  DATE OF BIRTH  CALL BACK NUMBER  REASON FOR CALL**this is important as we prioritize the call backs  YOU WILL RECEIVE A CALL BACK THE SAME DAY AS LONG AS YOU CALL BEFORE 4:00 PM  At the [facility], you and your health needs are our priority. As part of our continuing mission to provide you with exceptional heart care, we have created designated Provider Care Teams. These Care Teams include your primary Cardiologist (physician) and Advanced Practice Providers (APPs- Physician Assistants and Nurse Practitioners) who all work together to provide you with the care you need, when you need it.   You may see any of the following providers on your designated Care Team at your next follow up:  Dr Arvilla Meres  Dr Carron Curie, NP  Robbie Lis, [place]  Karle Plumber, PharmD   Please be sure to bring in all your medications bottles to every appointment.

## 2020-11-07 NOTE — Addendum Note (Signed)
Encounter addended by: Laurey Morale, MD on: 11/07/2020 10:34 PM  Actions taken: Clinical Note Signed

## 2020-11-07 NOTE — Progress Notes (Addendum)
PCP: Ming Mcmannis-Scocuzza, Pasty Spillers, MD HF Cardiology: Dr. Shirlee Latch  34 y.o. with history of HTN and CHF was referred by Ward Givens for evaluation of CHF.  Patient was first diagnosed with CHF in 4/13, about 6 months after delivering her first and only child.  LV EF at that time was 20-25% by echo. With medical management, it had improved to 45-50% by 10/13.  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  She was doing ok after that until 9/20, when she developed COVID-19 infection.  She had respiratory symptoms but recovered after a week or so.  However, she does not feel like she got back to normal.  In 11/20, she began to notice very significant exertional dyspnea and orthopnea.  Echo was done in 11/20, showing EF back down to 20% with severe LV dilation, normal RV.  She was started on Lasix and subsequently Entresto, Coreg, and spironolactone.   Coronary CTA in 1/21 showed mild proximal-mid LAD stenosis with CAC 48.9 Agatston units (98th percentile).   Cardiac MRI in 12/21 showed EF 26%, moderate LV dilation, RV EF 44%, nonspecific RV insertion site LGE.   She returns for followup of CHF. Weight is up 10 lbs.  LDL was high when last checked but was out of Crestor.  No dyspnea walking on flat ground.  Dyspnea when she moves faster such as when she is chasing around her son.  Occasional atypical chest pain, nothing exertional.  No orthopnea/PND.  She developed UTI and headaches on Farxiga so stopped it.   Labs (2/20): creatinine 0.77  Labs (12/20): BNP 135, K 4, creatinine 0.82, LDL 152 Labs (11/21): LDL 166, K 4.3, creatinine 0.79  PMH: 1. HTN 2. COVID-19 infection 9/20 3. Chronic systolic CHF: CHF diagnosed 4/13 about 6 months after delivering her 1st and only child.  Pregnancy complicated by poorly controlled HTN.  - 4/13 echo: EF 20-25% with diffuse hypokinesis.  - 10/13 echo: EF 45-50% - Echo (11/20): EF 20%, severe LV dilation, normal RV size and systolic function. - Coronary CTA (1/21): Mild  proximal-mid LAD stenosis, CAD 48.9 Agatston units (98th percentile).  - Cardiac MRI (12/21):EF 26%, moderate LV dilation, RV EF 44%, nonspecific RV insertion site LGE.   4. CAD:  Coronary CTA (1/21) with mild proximal-mid LAD stenosis, CAD 48.9 Agatston units (98th percentile).    SH: Lives in [place], 1 child, works as Haematologist, no ETOH, no drugs, no smoking.   FH: No cardiac disease that she knows about.   ROS: All systems reviewed and negative except as per HPI.   Current Outpatient Medications  Medication Sig Dispense Refill  . albuterol (PROVENTIL HFA;VENTOLIN HFA) 108 (90 Base) MCG/ACT inhaler Inhale 1-2 puffs into the lungs every 6 (six) hours as needed for wheezing or shortness of breath. 1 Inhaler 12  . cetirizine (ZYRTEC) 10 MG tablet Take 1 tablet (10 mg total) by mouth at bedtime as needed for allergies. 90 tablet 3  . rosuvastatin (CRESTOR) 10 MG tablet Take 1 tablet (10 mg total) by mouth daily. 30 tablet 5  . spironolactone (ALDACTONE) 25 MG tablet TAKE 1 TABLET BY MOUTH ONCE DAILY. 90 tablet 3  . carvedilol (COREG) 12.5 MG tablet Take 1.5 tablets (18.75 mg total) by mouth 2 (two) times daily with a meal. 90 tablet 1  . furosemide (LASIX) 20 MG tablet Take 1 tablet (20 mg total) by mouth every other day. 15 tablet 3  . sacubitril-valsartan (ENTRESTO) 97-103 MG Take 1 tablet by mouth 2 (two) times daily.  60 tablet 6   No current facility-administered medications for this encounter.    BP 124/80   Pulse 72   Wt 115.5 kg (254 lb 9.6 oz)   SpO2 98%   BMI 41.09 kg/m   General: NAD Neck: No JVD, no thyromegaly or thyroid nodule.  Lungs: Clear to auscultation bilaterally with normal respiratory effort. CV: Nondisplaced PMI.  Heart regular S1/S2, no S3/S4, no murmur.  No peripheral edema.  No carotid bruit.  Normal pedal pulses.  Abdomen: Soft, nontender, no hepatosplenomegaly, no distention.  Skin: Intact without lesions or rashes.  Neurologic: Alert  and oriented x 3.  Psych: Normal affect. Extremities: No clubbing or cyanosis.  HEENT: Normal.   Assessment/Plan: 1. Chronic systolic CHF: Diagnosed after delivery of child in 4/13, thought initially to be peri-partum cardiomyopathy.  EF improved from 20-25% to 45-50% by 10/13.  COVID-19 infection in 9/20, ongoing dyspnea prompted an echo, which showed EF 20% in 11/20.  Question if this is a new process, COVID-19 myocarditis?  She actually has age-advanced coronary disease on coronary CTA but no obstructive disease.  No family history of cardiomyopathy.  No substance abuse. Cardiac MRI in 12/21 showed EF 26% with moderate LV dilation, near normal RV function, and nonspecific RV insertion site LGE.  She is not volume overloaded on exam, NYHA class II.  - Increase Entresto to 97/103 bid.  BMET today and 10 days.  - Continue spironolactone 25 daily.  - Increase Coreg to 18.75 mg bid.  - She can decrease Lasix to 20 mg qod.  - She was unable to tolerate dapagliflozin due to headache and UTI.  - EF remains low on cardiac MRI, I will refer for ICD evaluation.  Not CRT candidate.  2. HTN: BP is not elevated at this point.  3. Coronary artery disease: Non-obstructive disease on 1/21 CTA but age-advanced.   - She has started on Crestor 10 mg daily.  Lipids/LFTs in 2 months.  4. Obesity: We discussed diet/exercise for weight loss.  - [facility].   Followup in 3 wks with HF pharmacist for medication titration.  Followup in 6 wks with me  Patricia Reynolds 11/07/2020

## 2020-11-10 ENCOUNTER — Encounter: Payer: Self-pay | Admitting: Internal Medicine

## 2020-11-20 ENCOUNTER — Encounter: Payer: Self-pay | Admitting: Cardiology

## 2020-11-26 ENCOUNTER — Encounter (HOSPITAL_COMMUNITY): Payer: Self-pay

## 2020-11-26 ENCOUNTER — Other Ambulatory Visit (HOSPITAL_COMMUNITY): Payer: Self-pay | Admitting: *Deleted

## 2020-11-26 MED ORDER — ENTRESTO 97-103 MG PO TABS: 1.0000 | ORAL_TABLET | Freq: Two times a day (BID) | ORAL | 6 refills | Status: AC

## 2020-11-27 ENCOUNTER — Encounter (HOSPITAL_COMMUNITY): Payer: BC Managed Care – PPO | Admitting: Cardiology

## 2020-11-30 NOTE — Progress Notes (Incomplete)
***In Progress*** PCP: McLean-Scocuzza, Pasty Spillers, MD HF Cardiology: Dr. Shirlee Latch  HPI:  35 y.o. with history of HTN and CHF was referred by Ward Givens for evaluation of CHF.  Patient was first diagnosed with CHF in 4/13, about 6 months after delivering her first and only child.  LV EF at that time was 20-25% by echo. With medical management, it had improved to 45-50% by 10/13.  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  She was doing ok after that until 9/20, when she developed COVID-19 infection.  She had respiratory symptoms but recovered after a week or so.  However, she does not feel like she got back to normal.  In 11/20, she began to notice very significant exertional dyspnea and orthopnea.  Echo was done in 11/20, showing EF back down to 20% with severe LV dilation, normal RV.  She was started on furosemide and subsequently Entresto, carvedilol, and spironolactone.  Coronary CTA in 1/21 showed mild proximal-mid LAD stenosis with CAC 48.9 Agatston units (98th percentile).   Cardiac MRI in 12/21 showed EF 26%, moderate LV dilation, RV EF 44%, nonspecific RV insertion site LGE.   She recently returned for followup of CHF on 11/06/20. Weight was up 10 lbs.  LDL was high when last checked but was out of rosuvastatin.  No dyspnea walking on flat ground.  Dyspnea when she moved faster such as when she was chasing around her son.  Occasional atypical chest pain, nothing exertional.  No orthopnea/PND.  She developed UTI and headaches on Farxiga so stopped it.   Today she returns to HF clinic for pharmacist medication titration. At last visit with MD, Sherryll Burger was increased to 97/103 mg BID, carvedilol increased to 18.75 mg BID, and furosemide decreased to 20 mg QOD.   124/80, 72, 254 lbs - needs BMET *Probably didn't start Entresto until after 1/5 Plan A: Incr carvedilol 25 BID if euvolemic but wt was up last time  Plan B: BiDil - but she didn't tolerate dapagliflozin bc of HA, unsure if she will  tolerate this McLean on 01/13/21  Overall feeling ***. Dizziness, lightheadedness, fatigue:  Chest pain or palpitations:  How is your breathing?: *** SOB: Able to complete all ADLs. Activity level ***  Weight at home pounds. Takes furosemide/torsemide/bumex *** mg *** daily.  LEE PND/Orthopnea  Appetite *** Low-salt diet:   Physical Exam Cost/affordability of meds     HF Medications: Carvedilol 18.75 mg BID Entresto 97/103 mg BID Spironolactone 25 mg daily Furosemide 20 mg QOD  Has the patient been experiencing any side effects to the medications prescribed?  {YES NO:22349}  Does the patient have any problems obtaining medications due to transportation or finances?   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 Uses copay card for [facility].  Understanding of regimen: {excellent/good/fair/poor:19665} Understanding of indications: {excellent/good/fair/poor:19665} Potential of compliance: {excellent/good/fair/poor:19665} Patient understands to avoid NSAIDs. Patient understands to avoid decongestants.    Pertinent Lab Values: . Serum creatinine ***, BUN ***, Potassium ***, Sodium ***, BNP ***, Magnesium ***, Digoxin ***   Vital Signs: . Weight: *** (last clinic weight: 254 lbs) . Blood pressure: ***  . Heart rate: ***   Assessment/Plan: 1. Chronic systolic CHF: Diagnosed after delivery of child in 4/13, thought initially to be peri-partum cardiomyopathy.  EF improved from 20-25% to 45-50% by 10/13.  COVID-19 infection in 9/20, ongoing dyspnea prompted an echo, which showed EF 20% in 11/20.  Question if this is a new process, COVID-19 myocarditis?  She actually has age-advanced coronary  disease on coronary CTA but no obstructive disease.  No family history of cardiomyopathy.  No substance abuse. Cardiac MRI in 12/21 showed EF 26% with moderate LV dilation, near normal RV function, and nonspecific RV insertion site LGE.  She is not volume overloaded on exam, NYHA class II.  -  Continue furosemide 20 mg QOD - Continue carvedilol 18.75 mg BID - Continue Entresto 97/103 mg BID - Continue spironolactone 25 mg daily - She was unable to tolerate dapagliflozin due to headache and UTI.  - EF remains low on cardiac MRI, previously referred for ICD evaluation.  Not CRT candidate.   2. HTN: BP is not elevated at this point.   3. Coronary artery disease: Non-obstructive disease on 1/21 CTA but age-advanced.   - Continue rosuvastatin 10 mg daily.  Lipids/LFTs in 2 months.   4. Obesity: We discussed diet/exercise for weight loss.  - [facility].   Karle Plumber, PharmD, BCPS, BCCP, CPP Heart Failure Clinic Pharmacist [phone]

## 2020-12-01 ENCOUNTER — Inpatient Hospital Stay (HOSPITAL_COMMUNITY): Admission: RE | Admit: 2020-12-01 | Payer: BC Managed Care – PPO | Source: Ambulatory Visit

## 2020-12-10 ENCOUNTER — Other Ambulatory Visit: Payer: Self-pay

## 2020-12-10 ENCOUNTER — Encounter: Payer: Self-pay | Admitting: Cardiology

## 2020-12-10 ENCOUNTER — Ambulatory Visit (INDEPENDENT_AMBULATORY_CARE_PROVIDER_SITE_OTHER): Payer: BC Managed Care – PPO | Admitting: Cardiology

## 2020-12-10 ENCOUNTER — Telehealth (HOSPITAL_COMMUNITY): Payer: Self-pay | Admitting: Pharmacist

## 2020-12-10 VITALS — BP 132/84 | HR 87 | Ht 66.0 in | Wt 256.0 lb

## 2020-12-10 DIAGNOSIS — I5022 Chronic systolic (congestive) heart failure: Secondary | ICD-10-CM

## 2020-12-10 DIAGNOSIS — I428 Other cardiomyopathies: Secondary | ICD-10-CM | POA: Diagnosis not present

## 2020-12-10 DIAGNOSIS — I110 Hypertensive heart disease with heart failure: Secondary | ICD-10-CM | POA: Diagnosis not present

## 2020-12-10 NOTE — Patient Instructions (Signed)
Medication Instructions:  Your physician recommends that you continue on your current medications as directed. Please refer to the Current Medication list given to you today.  Labwork: None ordered.  Testing/Procedures: None ordered.  Follow-Up: Your physician wants you to follow-up in: 6 months with Dr. Lalla Brothers.  You will receive a reminder letter in the mail two months in advance. If you don't receive a letter, please call our office to schedule the follow-up appointment.   Any Other Special Instructions Will Be Listed Below (If Applicable).  If you need a refill on your cardiac medications before your next appointment, please call your pharmacy.   The following dates are available for procedure:  February 2, 9, 16 March 2, 9, 16, 23, 30  Please reach out if you would like to schedule this procedure--Jenny RN

## 2020-12-10 NOTE — Progress Notes (Signed)
Electrophysiology Office Note:    Date:  12/10/2020   ID:  YOSHINO BROCCOLI, DOB [birth year], MRN [id number]  PCP:  McLean-Scocuzza, Pasty Spillers, MD  [facility] Cardiologist:  Julien Nordmann, MD  [facility] Electrophysiologist:  Lanier Prude, MD   Referring MD: McLean-Scocuzza, French Ana *   Chief Complaint: Nonischemic cardiomyopathy  History of Present Illness:    Patricia Reynolds is a 35 y.o. female who presents for an evaluation of nonischemic cardiomyopathy at the request of Dr. Shirlee Latch. Their medical history includes chronic combined systolic and diastolic heart failure, COVID-19 infection, hypertension, morbid obesity, nonobstructive coronary artery disease.  Patient was seen by Marca Ancona in the heart failure clinic on November 06, 2020.  At that appointment, she was felt to be euvolemic.Marland Kitchen  Her Entresto was increased.  Given her persistently low ejection fraction on the recent cardiac MRI, she was referred for ICD implant.  Past Medical History:  Diagnosis Date  . Bronchitis    11/2017   . Chicken pox   . Chronic combined systolic (congestive) and diastolic (congestive) heart failure (HCC) 03/14/2012   a. 02/2012 Echo: EF 20-25%, diff HK, Gr2 DD; b. 08/2012 Echo: EF 45-5%, mild LVH; c. 09/2019 Echo: EF 20%, sev dil LV, glob HK. Nl RV fxn. Mod dil LA.  Marland Kitchen COVID-19 virus infection 07/2019  . Hypertensive heart disease   . Hypertensive NICM (nonischemic cardiomyopathy) (HCC)    a. 02/2012 Echo: EF 20-25%, diff HK, Gr2 DD; b. 03/2012 ETT: Ex time 6:11, max HR 171, No ST/T changes; c. 08/2012 Echo: EF 45-50%, mild LVH; d. 09/2019 Echo: EF 20%, sev dil LV, glob HK. Nl RV fxn. Mod dil LA.  . Maternal anemia complicating pregnancy, childbirth, or the puerperium 09/22/2011  . Morbid obesity (HCC)   . Nonobstructive CAD (coronary artery disease)    a. 11/2019 Cor CTA: Cor Ca2+ 48.9 (98th%'ile). LM nl, LAD nl, LCX nl, RCA <50% prox/mid mixed plaque.    Past Surgical History:   Procedure Laterality Date  . CESAREAN SECTION  09/21/2011   Procedure: CESAREAN SECTION;  Surgeon: Roseanna Rainbow, MD;  Location: [facility];  Service: Gynecology;  Laterality: N/A;  . FINGER SURGERY     right thumb repair 2006 playing Basketball     Current Medications: Current Meds  Medication Sig  . albuterol (PROVENTIL HFA;VENTOLIN HFA) 108 (90 Base) MCG/ACT inhaler Inhale 1-2 puffs into the lungs every 6 (six) hours as needed for wheezing or shortness of breath.  . carvedilol (COREG) 12.5 MG tablet Take 1.5 tablets (18.75 mg total) by mouth 2 (two) times daily with a meal.  . cetirizine (ZYRTEC) 10 MG tablet Take 1 tablet (10 mg total) by mouth at bedtime as needed for allergies.  . furosemide (LASIX) 20 MG tablet Take 1 tablet (20 mg total) by mouth every other day.  . rosuvastatin (CRESTOR) 10 MG tablet Take 1 tablet (10 mg total) by mouth daily.  Marland Kitchen spironolactone (ALDACTONE) 25 MG tablet TAKE 1 TABLET BY MOUTH ONCE DAILY.     Allergies:   Darvocet [propoxyphene n-acetaminophen]   Social History   Socioeconomic History  . Marital status: Single    Spouse name: Not on file  . Number of children: 1  . Years of education: Not on file  . Highest education level: Not on file  Occupational History  . Not on file  Tobacco Use  . Smoking status: Never Smoker  . Smokeless tobacco: Never Used  Vaping Use  . Vaping  Use: Never used  Substance and Sexual Activity  . Alcohol use: No  . Drug use: No  . Sexual activity: Yes  Other Topics Concern  . Not on file  Social History Narrative   Lives in [place], [place] with boyfriend.    1 son 72 y.o    Works at [employer]   Wears seatbelt    Feels safe in relationship    No guns    Social Determinants of Corporate investment banker Strain: Not on file  Food Insecurity: Not on file  Transportation Needs: Not on file  Physical Activity: Not on file  Stress: Not on file  Social Connections: Not on file     Family History: The  patient's family history includes Depression in her maternal grandmother; Diabetes in her mother; Diabetes type II in her mother; Hyperlipidemia in her father; Hypertension in her father and mother; Stroke in her paternal grandmother. There is no history of Anesthesia problems, Hypotension, Malignant hyperthermia, Pseudochol deficiency, or Heart attack.  ROS:   Please see the history of present illness.    All other systems reviewed and are negative.  EKGs/Labs/Other Studies Reviewed:    The following studies were reviewed today:  October 12, 2019 echo personally reviewed Left ventricular function severely decreased, 20% Severely dilated left ventricle Normal right ventricular function  10/31/2020 Cardiac MRI Limited images of the lung fields showed no gross abnormalities. Trivial pericardial effusion. Moderately dilated left ventricle. Normal wall thickness. EF 26% with diffuse hypokinesis. Normal right ventricular size with mildly decreased systolic function, EF 44%. Mild left atrial enlargement. Normal right atrium. Trileaflet aortic valve, no significant regurgitation or stenosis. Trivial mitral regurgitation noted. There is a small area of late gadolinium enhancement subepicardially in the basal to mid inferoseptum near the RV insertion.      Recent Labs: 10/02/2020: ALT 11; Hemoglobin 12.8; Platelets 337 11/06/2020: BUN 13; Creatinine, Ser 0.78; Potassium 4.0; Sodium 136  Recent Lipid Panel    Component Value Date/Time   CHOL 209 (H) 10/02/2020 1212   TRIG 96 10/02/2020 1212   HDL 47 10/02/2020 1212   CHOLHDL 4.4 10/02/2020 1212   CHOLHDL 5 01/03/2018 0837   VLDL 17.0 01/03/2018 0837   LDLCALC 145 (H) 10/02/2020 1212   LDLDIRECT 144 (H) 10/02/2020 1212    Physical Exam:    VS:  BP 132/84   Pulse 87   Ht 5\' 6"  (1.676 m)   Wt 256 lb (116.1 kg)   SpO2 97%   BMI 41.32 kg/m     Wt Readings from Last 3 Encounters:  12/10/20 256 lb (116.1 kg)  11/06/20 254  lb 9.6 oz (115.5 kg)  10/02/20 261 lb 2 oz (118.4 kg)     GEN:  Well nourished, well developed in no acute distress.  Morbidly obese HEENT: Normal NECK: No JVD; No carotid bruits LYMPHATICS: No lymphadenopathy CARDIAC: RRR, no murmurs, rubs, gallops RESPIRATORY:  Clear to auscultation without rales, wheezing or rhonchi  ABDOMEN: Soft, non-tender, non-distended MUSCULOSKELETAL:  No edema; No deformity  SKIN: Warm and dry NEUROLOGIC:  Alert and oriented x 3 PSYCHIATRIC:  Normal affect   ASSESSMENT:    1. NICM (nonischemic cardiomyopathy) (HCC)   2. Chronic systolic heart failure (HCC)   3. Hypertensive heart disease with heart failure (HCC)   4. Morbid obesity (HCC)    PLAN:    In order of problems listed above:  1. Chronic systolic heart failure Unclear etiology.  Most recent MRI shows an ejection fraction  of 25% with a dilated left ventricle.  She has NYHA class II symptoms.  Given the persistent decreased ejection fraction on echo and cardiac MRI despite good medical therapy, I have recommended a defibrillator for primary prevention.  During today's visit we discussed the available options for ICD including transvenous and subcutaneous.  While there is some benefits to a subcutaneous device, the added ability to monitor thoracic impedance and volume status would be advantageous and would require a transvenous device.  I would plan on a single coil VVI ICD.  The procedure was discussed in detail with the patient during today's visit including the risks, recovery time.  She would like some time to think it over before making a final decision which I think is very reasonable.  I will plan on scheduling an appointment with her in 6 months to follow back up if she has not made a decision by that point.  We will also provide some upcoming procedure dates in case she decides to proceed.  We could plan on performing the procedure in [place] or in [place] depending on what works best for  her schedule and proximity to home.  The patient has a non ischemic CM (EF 25%), NYHA Class II CHF, and CAD.  She is referred by Dr Shirlee Latch for risk stratification of sudden death and consideration of ICD implantation.  At this time, she meets criteria for ICD implantation for primary prevention of sudden death.  I have had a thorough discussion with the patient reviewing options.  The patient and their family (if available) have had opportunities to ask questions and have them answered.     Risks, benefits, alternatives to ICD implantation were discussed in detail with the patient today. The patient understands that the risks include but are not limited to bleeding, infection, pneumothorax, perforation, tamponade, vascular damage, renal failure, MI, stroke, death, inappropriate shocks, and lead dislodgement.  2. hypertension Controlled during today's visit.  Continue Coreg, Entresto, spironolactone  3. obesity  Medication Adjustments/Labs and Tests Ordered: Current medicines are reviewed at length with the patient today.  Concerns regarding medicines are outlined above.  No orders of the defined types were placed in this encounter.  No orders of the defined types were placed in this encounter.    Signed, Steffanie Dunn, MD, [facility]  12/10/2020 4:41 PM    Electrophysiology [facility]

## 2020-12-10 NOTE — Telephone Encounter (Signed)
Patient Advocate Encounter   Received notification from [employer] that prior authorization for Sherryll Burger is required.   PA submitted on CoverMyMeds Key [id number] Status is pending   Will continue to follow.   Karle Plumber, PharmD, BCPS, BCCP, CPP Heart Failure Clinic Pharmacist [phone]

## 2020-12-11 NOTE — Telephone Encounter (Signed)
Advanced Heart Failure Patient Advocate Encounter  Prior Authorization for Sherryll Burger has been approved.    Effective dates: 12/10/20 through 12/10/21  Karle Plumber, PharmD, BCPS, BCCP, CPP Heart Failure Clinic Pharmacist [phone]

## 2021-01-02 ENCOUNTER — Ambulatory Visit: Payer: BC Managed Care – PPO | Admitting: Cardiovascular Disease

## 2021-01-13 ENCOUNTER — Other Ambulatory Visit: Payer: Self-pay

## 2021-01-13 ENCOUNTER — Encounter (HOSPITAL_COMMUNITY): Payer: Self-pay | Admitting: Cardiology

## 2021-01-13 ENCOUNTER — Ambulatory Visit (HOSPITAL_COMMUNITY)
Admission: RE | Admit: 2021-01-13 | Discharge: 2021-01-13 | Disposition: A | Discharge status: Home / Self Care | Payer: BC Managed Care – PPO | Source: Ambulatory Visit | Attending: Cardiology | Admitting: Cardiology

## 2021-01-13 VITALS — BP 142/80 | HR 68 | Wt 259.4 lb

## 2021-01-13 DIAGNOSIS — Z8616 Personal history of COVID-19: Secondary | ICD-10-CM | POA: Diagnosis not present

## 2021-01-13 DIAGNOSIS — I2510 Atherosclerotic heart disease of native coronary artery without angina pectoris: Secondary | ICD-10-CM | POA: Diagnosis not present

## 2021-01-13 DIAGNOSIS — Z6841 Body Mass Index (BMI) 40.0 and over, adult: Secondary | ICD-10-CM | POA: Insufficient documentation

## 2021-01-13 DIAGNOSIS — I5022 Chronic systolic (congestive) heart failure: Secondary | ICD-10-CM | POA: Diagnosis not present

## 2021-01-13 DIAGNOSIS — Z79899 Other long term (current) drug therapy: Secondary | ICD-10-CM | POA: Diagnosis not present

## 2021-01-13 DIAGNOSIS — I110 Hypertensive heart disease with heart failure: Secondary | ICD-10-CM | POA: Diagnosis not present

## 2021-01-13 DIAGNOSIS — E669 Obesity, unspecified: Secondary | ICD-10-CM | POA: Diagnosis not present

## 2021-01-13 LAB — BASIC METABOLIC PANEL
Anion gap: 8 (ref 5–15)
BUN: 10 mg/dL (ref 6–20)
CO2: 25 mmol/L (ref 22–32)
Calcium: 9.1 mg/dL (ref 8.9–10.3)
Chloride: 104 mmol/L (ref 98–111)
Creatinine, Ser: 0.9 mg/dL (ref 0.44–1.00)
GFR, Estimated: >60 mL/min (ref >60)
Glucose, Bld: 115 mg/dL — ABNORMAL HIGH (ref 70–99)
Potassium: 4.2 mmol/L (ref 3.5–5.1)
Sodium: 137 mmol/L (ref 135–145)

## 2021-01-13 LAB — LIPID PANEL
Cholesterol: 141 mg/dL (ref 0–200)
HDL: 50 mg/dL (ref >40)
LDL Cholesterol: 75 mg/dL (ref 0–99)
Total CHOL/HDL Ratio: 2.8 RATIO
Triglycerides: 78 mg/dL (ref <150)
VLDL: 16 mg/dL (ref 0–40)

## 2021-01-13 MED ORDER — CARVEDILOL 25 MG PO TABS: 25.0000 mg | ORAL_TABLET | Freq: Two times a day (BID) | ORAL | 3 refills | Status: AC

## 2021-01-13 NOTE — Patient Instructions (Signed)
Labs done today. We will contact you only if your labs are abnormal.  INCREASE Carvedilol to 25mg  (1 tablet) by mouth 2 times daily.  No other medication changes were made. Please continue all current medications as prescribed.  Your physician recommends that you schedule a follow-up appointment in: 3 weeks for an appointment with our Clinic Pharmacist, Lauren and in 3 months for an appointment with Dr. .   If you have any questions or concerns before your next appointment please send Shirlee Latch a message through [place] or call our office at [phone].    TO LEAVE A MESSAGE FOR THE NURSE SELECT OPTION 2, PLEASE LEAVE A MESSAGE INCLUDING: . YOUR NAME . DATE OF BIRTH . CALL BACK NUMBER . REASON FOR CALL**this is important as we prioritize the call backs  YOU WILL RECEIVE A CALL BACK THE SAME DAY AS LONG AS YOU CALL BEFORE 4:00 PM   Do the following things EVERYDAY: 1) Weigh yourself in the morning before breakfast. Write it down and keep it in a log. 2) Take your medicines as prescribed 3) Eat low salt foods--Limit salt (sodium) to 2000 mg per day.  4) Stay as active as you can everyday 5) Limit all fluids for the day to less than 2 liters   At the Advanced Heart Failure Clinic, you and your health needs are our priority. As part of our continuing mission to provide you with exceptional heart care, we have created designated Provider Care Teams. These Care Teams include your primary Cardiologist (physician) and Advanced Practice Providers (APPs- Physician Assistants and Nurse Practitioners) who all work together to provide you with the care you need, when you need it.   You may see any of the following providers on your designated Care Team at your next follow up: [phone] Dr Marland Kitchen . Dr Arvilla Meres . Marca Ancona, NP . Tonye Becket, PA . Robbie Lis, PharmD   Please be sure to bring in all your medications bottles to every appointment.

## 2021-01-14 NOTE — Progress Notes (Signed)
PCP: McLean-Scocuzza, Pasty Spillers, MD HF Cardiology: Dr. Shirlee Latch  35 y.o. with history of HTN and CHF was referred by Ward Givens for evaluation of CHF.  Patient was first diagnosed with CHF in 4/13, about 6 months after delivering her first and only child.  LV EF at that time was 20-25% by echo. With medical management, it had improved to 45-50% by 10/13.  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  She was doing ok after that until 9/20, when she developed COVID-19 infection.  She had respiratory symptoms but recovered after a week or so.  However, she does not feel like she got back to normal.  In 11/20, she began to notice very significant exertional dyspnea and orthopnea.  Echo was done in 11/20, showing EF back down to 20% with severe LV dilation, normal RV.  She was started on Lasix and subsequently Entresto, Coreg, and spironolactone.   Coronary CTA in 1/21 showed mild proximal-mid LAD stenosis with CAC 48.9 Agatston units (98th percentile).   Cardiac MRI in 12/21 showed EF 26%, moderate LV dilation, RV EF 44%, nonspecific RV insertion site LGE.   She returns for followup of CHF. Weight is up about 5 lbs.  She saw Dr. Lalla Brothers and is thinking about ICD.  She walks for exercise on most days.  She is short of breath only with heavy exertion.  No chest pain.  No lightheadedness. BP high today, SBP in 120s at home. She feels swollen/edematous on days that she does not take Lasix (every other day).   Labs (2/20): creatinine 0.77  Labs (12/20): BNP 135, K 4, creatinine 0.82, LDL 152 Labs (11/21): LDL 384, K 4.3, creatinine 0.79 Labs (12/21): K 4, creatinine 0.78  PMH: 1. HTN 2. COVID-19 infection 9/20 3. Chronic systolic CHF: CHF diagnosed 4/13 about 6 months after delivering her 1st and only child.  Pregnancy complicated by poorly controlled HTN.  - 4/13 echo: EF 20-25% with diffuse hypokinesis.  - 10/13 echo: EF 45-50% - Echo (11/20): EF 20%, severe LV dilation, normal RV size and systolic  function. - Coronary CTA (1/21): Mild proximal-mid LAD stenosis, CAD 48.9 Agatston units (98th percentile).  - Cardiac MRI (12/21):EF 26%, moderate LV dilation, RV EF 44%, nonspecific RV insertion site LGE.   4. CAD:  Coronary CTA (1/21) with mild proximal-mid LAD stenosis, CAD 48.9 Agatston units (98th percentile).    SH: Lives in [place], 1 child, works as Haematologist, no ETOH, no drugs, no smoking.   FH: No cardiac disease that she knows about.   ROS: All systems reviewed and negative except as per HPI.   Current Outpatient Medications  Medication Sig Dispense Refill  . albuterol (PROVENTIL HFA;VENTOLIN HFA) 108 (90 Base) MCG/ACT inhaler Inhale 1-2 puffs into the lungs every 6 (six) hours as needed for wheezing or shortness of breath. 1 Inhaler 12  . cetirizine (ZYRTEC) 10 MG tablet Take 1 tablet (10 mg total) by mouth at bedtime as needed for allergies. 90 tablet 3  . furosemide (LASIX) 20 MG tablet Take 1 tablet (20 mg total) by mouth every other day. 15 tablet 3  . rosuvastatin (CRESTOR) 10 MG tablet Take 1 tablet (10 mg total) by mouth daily. 30 tablet 5  . sacubitril-valsartan (ENTRESTO) 97-103 MG Take 1 tablet by mouth 2 (two) times daily. 60 tablet 6  . spironolactone (ALDACTONE) 25 MG tablet TAKE 1 TABLET BY MOUTH ONCE DAILY. 90 tablet 3  . carvedilol (COREG) 25 MG tablet Take 1 tablet (25 mg total)  by mouth 2 (two) times daily with a meal. 180 tablet 3   No current facility-administered medications for this encounter.    BP (!) 142/80   Pulse 68   Wt 117.7 kg (259 lb 6.4 oz)   SpO2 99%   BMI 41.87 kg/m   General: NAD Neck: No JVD, no thyromegaly or thyroid nodule.  Lungs: Clear to auscultation bilaterally with normal respiratory effort. CV: Nondisplaced PMI.  Heart regular S1/S2, no S3/S4, no murmur.  No peripheral edema.  No carotid bruit.  Normal pedal pulses.  Abdomen: Soft, nontender, no hepatosplenomegaly, no distention.  Skin: Intact without  lesions or rashes.  Neurologic: Alert and oriented x 3.  Psych: Normal affect. Extremities: No clubbing or cyanosis.  HEENT: Normal.   Assessment/Plan: 1. Chronic systolic CHF: Diagnosed after delivery of child in 4/13, thought initially to be peri-partum cardiomyopathy.  EF improved from 20-25% to 45-50% by 10/13.  COVID-19 infection in 9/20, ongoing dyspnea prompted an echo, which showed EF 20% in 11/20.  Question if this is a new process, COVID-19 myocarditis?  She actually has age-advanced coronary disease on coronary CTA but no obstructive disease.  No family history of cardiomyopathy.  No substance abuse. Cardiac MRI in 12/21 showed EF 26% with moderate LV dilation, near normal RV function, and nonspecific RV insertion site LGE.  NYHA class II, not volume overloaded but she does feel swollen on days she does not take Lasix. - Continue Entresto 97/103 bid.  BMET today.  - Continue spironolactone 25 daily.  - Increase Coreg to 25 mg bid.  - Increase Lasix to 20 mg daily.  - She was unable to tolerate dapagliflozin due to headache and UTI.  - She has seen EP to discuss ICD, still thinking about it.  Given her young age and persistently low EF, I recommended that she get an ICD.  Not CRT candidate.  2. HTN: BP well-controlled at home.  3. Coronary artery disease: Non-obstructive disease on 1/21 CTA but age-advanced.   - She has started on Crestor 10 mg daily.  Lipids/LFTs today.  4. Obesity: We discussed diet/exercise for weight loss.   Followup in 3 wks with HF pharmacist for medication titration (add Bidil).  Followup in 3 months with me  Marca Ancona 01/14/2021

## 2021-01-16 ENCOUNTER — Other Ambulatory Visit (HOSPITAL_COMMUNITY): Payer: Self-pay | Admitting: *Deleted

## 2021-01-16 MED ORDER — FUROSEMIDE 20 MG PO TABS: 20.0000 mg | ORAL_TABLET | Freq: Every day | ORAL | 3 refills | Status: DC

## 2021-01-31 IMAGING — MR MR CARD MORPHOLOGY WO/W CM
45 of 48 series · 45 of 48 positions shown · IV contrast (Contrast agent)
Comparison: none

CLINICAL DATA: Cardiomyopathy of uncertain etiology

EXAM:
CARDIAC MRI
TECHNIQUE: The patient was scanned on a 1.5 Tesla GE magnet. A dedicated
cardiac coil was used. Functional imaging was done using Fiesta
sequences. [DATE], and 4 chamber views were done to assess for RWMA's.
Modified Urvashi rule using a short axis stack was used to
calculate an ejection fraction on a dedicated work station using
Circle software. The patient received 8 cc of Gadavist. After 10
minutes inversion recovery sequences were used to assess for
infiltration and scar tissue.

[Series 4: [id number] · axial · 8.0mm · 1.56mm/px · 1 of 16 slices shown]
[im 1/16]
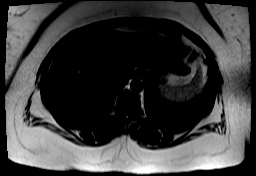

[Series 8: bSSFP · sagittal · 8.0mm · 1.79mm/px · 1 of 25 slices shown (1 of 24)]
[im 1/25]
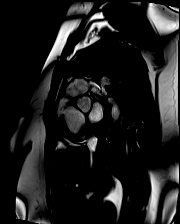

[Series 9: bSSFP · sagittal · 8.0mm · 1.79mm/px · 1 of 25 slices shown (2 of 24)]
[im 1/25]
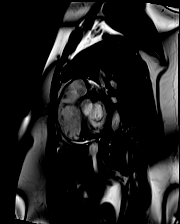

[Series 10: bSSFP · sagittal · 8.0mm · 1.79mm/px · 1 of 25 slices shown (3 of 24)]
[im 1/25]
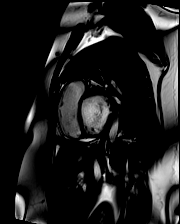

[Series 11: bSSFP · sagittal · 8.0mm · 1.79mm/px · 1 of 25 slices shown (4 of 24)]
[im 1/25]
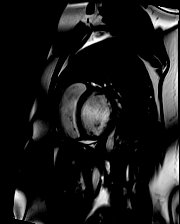

[Series 12: bSSFP · sagittal · 8.0mm · 1.79mm/px · 1 of 25 slices shown (5 of 24)]
[im 1/25]
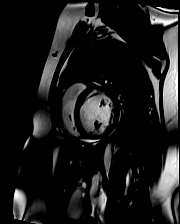

[Series 13: bSSFP · sagittal · 8.0mm · 1.79mm/px · 1 of 25 slices shown (6 of 24)]
[im 1/25]
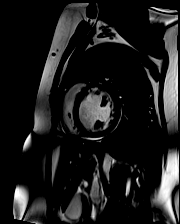

[Series 14: bSSFP · sagittal · 8.0mm · 1.79mm/px · 1 of 25 slices shown (7 of 24)]
[im 1/25]
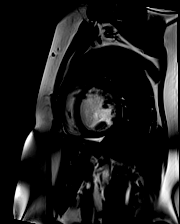

[Series 15: bSSFP · sagittal · 8.0mm · 1.79mm/px · 1 of 25 slices shown (8 of 24)]
[im 1/25]
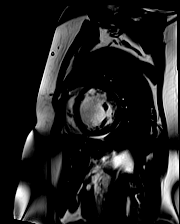

[Series 16: bSSFP · sagittal · 8.0mm · 1.79mm/px · 1 of 25 slices shown (9 of 24)]
[im 1/25]
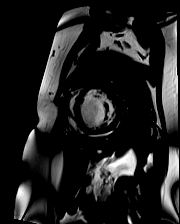

[Series 17: bSSFP · sagittal · 8.0mm · 1.79mm/px · 1 of 25 slices shown (10 of 24)]
[im 1/25]
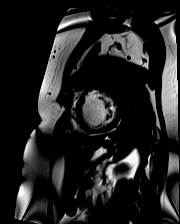

[Series 18: bSSFP · sagittal · 8.0mm · 1.79mm/px · 1 of 25 slices shown (11 of 24)]
[im 1/25]
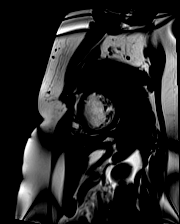

[Series 19: bSSFP · sagittal · 8.0mm · 1.79mm/px · 1 of 25 slices shown (12 of 24)]
[im 1/25]
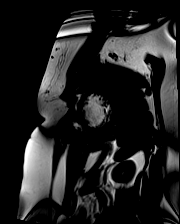

[Series 20: bSSFP · sagittal · 8.0mm · 1.79mm/px · 1 of 25 slices shown (13 of 24)]
[im 1/25]
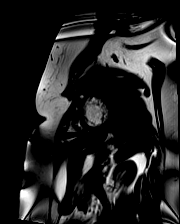

[Series 21: bSSFP · sagittal · 8.0mm · 1.79mm/px · 1 of 25 slices shown (14 of 24)]
[im 1/25]
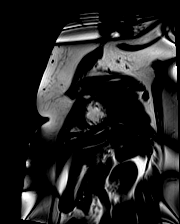

[Series 22: bSSFP · sagittal · 8.0mm · 1.79mm/px · 1 of 25 slices shown (15 of 24)]
[im 1/25]
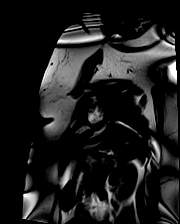

[Series 23: bSSFP · sagittal · 8.0mm · 1.79mm/px · 1 of 25 slices shown (16 of 24)]
[im 1/25]
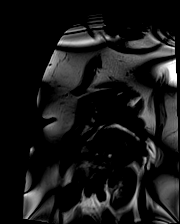

[Series 24: bSSFP · sagittal · 8.0mm · 1.79mm/px · 1 of 25 slices shown (17 of 24)]
[im 1/25]
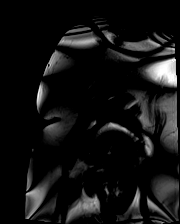

[Series 25: bSSFP · sagittal · 8.0mm · 1.79mm/px · 1 of 25 slices shown (18 of 24)]
[im 1/25]
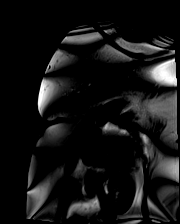

[Series 26: bSSFP · sagittal · 8.0mm · 1.79mm/px · 1 of 25 slices shown (19 of 24)]
[im 1/25]
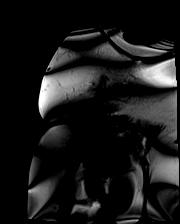

[Series 27: bSSFP · oblique · 6.0mm · 1.41mm/px · 1 of 25 slices shown (20 of 24)]
[im 1/25]
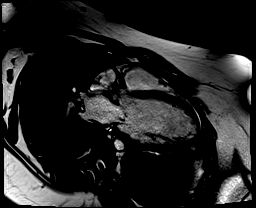

[Series 28: bSSFP · coronal · 6.0mm · 1.41mm/px · 1 of 25 slices shown (21 of 24)]
[im 1/25]
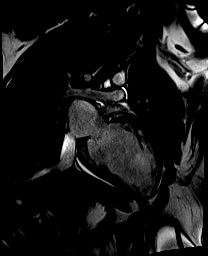

[Series 29: bSSFP · axial · 6.0mm · 1.41mm/px · 1 of 25 slices shown (22 of 24)]
[im 1/25]
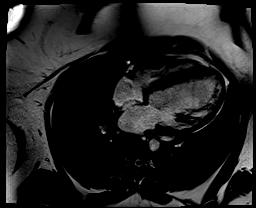

[Series 30: (id)_long_t1 · sagittal · 8.0mm · 1.56mm/px · 1 of 24 slices shown]
[im 1/24]
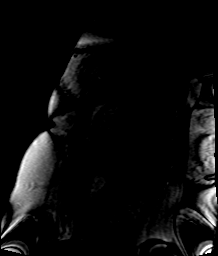

[Series 31: (id)_long_t1_moco · sagittal · 8.0mm · 1.56mm/px · 1 of 24 slices shown]
[im 1/24]
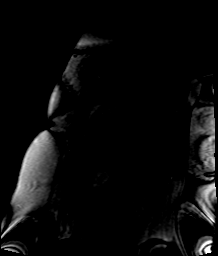

[Series 32: (id)_long_t1_moco_t1 · sagittal · 8.0mm · 1.56mm/px · 1 of 6 slices shown]
[im 1/6]
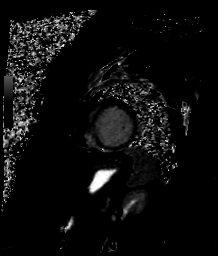

[Series 34: bSSFP · oblique · 6.0mm · 1.41mm/px · 1 of 25 slices shown (23 of 24)]
[im 1/25]
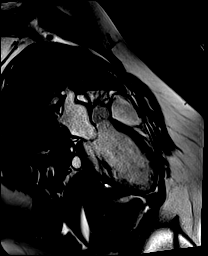

[Series 35: (id)_trufi · sagittal · 8.0mm · 2.08mm/px · 1 of 9 slices shown]
[im 1/9]
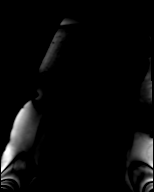

[Series 36: (id)_trufi_moco · sagittal · 8.0mm · 2.08mm/px · 1 of 9 slices shown]
[im 1/9]
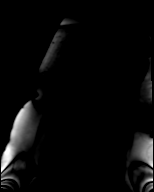

[Series 37: (id)_trufi_moco_t2 · sagittal · 8.0mm · 2.08mm/px · 1 of 3 slices shown]
[im 1/3]
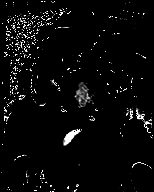

[Series 39: pre short axis · sagittal · non-contrast · 8.0mm · 2.50mm/px · 1 of 10 slices shown (1 of 6)]
[im 1/10]
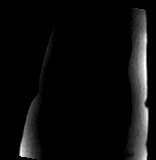

[Series 40: pre short axis · sagittal · non-contrast · 8.0mm · 2.50mm/px · 1 of 10 slices shown (2 of 6)]
[im 1/10]
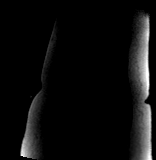

[Series 41: pre short axis · sagittal · non-contrast · 8.0mm · 2.50mm/px · 1 of 10 slices shown (3 of 6)]
[im 1/10]
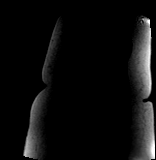

[Series 42: pre short axis · sagittal · non-contrast · 8.0mm · 2.50mm/px · 1 of 10 slices shown (4 of 6)]
[im 1/10]
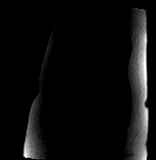

[Series 43: pre short axis · sagittal · non-contrast · 8.0mm · 2.50mm/px · 1 of 10 slices shown (5 of 6)]
[im 1/10]
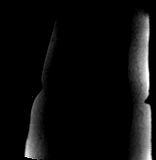

[Series 44: pre short axis · sagittal · non-contrast · 8.0mm · 2.50mm/px · 1 of 10 slices shown (6 of 6)]
[im 1/10]
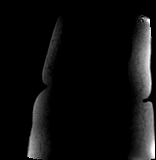

[Series 45: rest short axis · sagittal · 8.0mm · 2.25mm/px · 1 of 80 slices shown (1 of 6)]
[im 1/80]
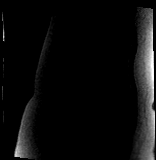

[Series 46: rest short axis · sagittal · 8.0mm · 2.25mm/px · 1 of 80 slices shown (2 of 6)]
[im 1/80]
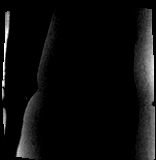

[Series 47: rest short axis · sagittal · 8.0mm · 2.25mm/px · 1 of 80 slices shown (3 of 6)]
[im 1/80]
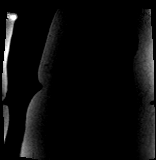

[Series 48: rest short axis · sagittal · 8.0mm · 2.25mm/px · 1 of 80 slices shown (4 of 6)]
[im 1/80]
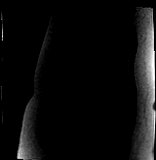

[Series 49: rest short axis · sagittal · 8.0mm · 2.25mm/px · 1 of 80 slices shown (5 of 6)]
[im 1/80]
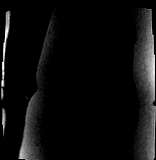

[Series 50: rest short axis · sagittal · 8.0mm · 2.25mm/px · 1 of 80 slices shown (6 of 6)]
[im 1/80]
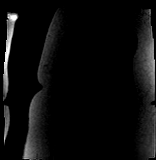

[Series 51: bSSFP · coronal · 6.0mm · 1.41mm/px · 1 of 25 slices shown (24 of 24)]
[im 1/25]
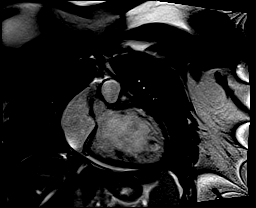

[Series 52: cine rvit · coronal · 6.0mm · 1.41mm/px · 1 of 25 slices shown]
[im 1/25]
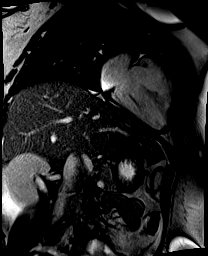

[Series 53: aortic valve cine · axial · 6.0mm · 1.41mm/px · 1 of 25 slices shown]
[im 1/25]
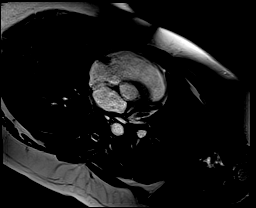

[45 of 48 positions shown; findings below may reference images not displayed]

FINDINGS: Limited images of the lung fields showed no gross abnormalities.

Trivial pericardial effusion. Moderately dilated left ventricle.
Normal wall thickness. EF 26% with diffuse hypokinesis. Normal right
ventricular size with mildly decreased systolic function, EF 44%.
Mild left atrial enlargement. Normal right atrium. Trileaflet aortic
valve, no significant regurgitation or stenosis. Trivial mitral
regurgitation noted.

There is a small area of late gadolinium enhancement subepicardially
in the basal to mid inferoseptum near the RV insertion.

Measurements:

LVEDV 336 mL

LVSV 88 mL
LVEF 26%

RVEDV 174 mL
RVSV 77 mL
RVEF 44%
IMPRESSION: 1. Moderately dilated left ventricle with EF 26%, diffuse
hypokinesis.

2.  Normal RV size with mildly decreased systolic function, EF 44%.

3. Small area of inferior RV insertion site LGE, this is nonspecific
and suggestive of pressure/volume overload.

Chai Tiger

## 2021-02-08 NOTE — Progress Notes (Incomplete)
***In Progress*** PCP: McLean-Scocuzza, Pasty Spillers, MD HF Cardiology: Dr. Shirlee Latch  HPI:  35 y.o. with history of HTN and CHF was referred by Ward Givens, NP for evaluation of CHF. Patient was first diagnosed with CHF in 02/2012, about 6 months after delivering her first and only child. LVEF at that time was 20-25% by echo. With medical management, it had improved to 45-50% by 08/2012. 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 She was doing ok after that until 07/2019, when she developed COVID-19 infection. She had respiratory symptoms but recovered after a week or so. However, she did not feel like she got back to normal. In 09/2019, she began to notice very significant exertional dyspnea and orthopnea. Echo was done in 09/2019, showing EF back down to 20% with severe LV dilation, normal RV. She was started on furosemide and subsequently Entresto, carvedilol, and spironolactone. Coronary CTA in 11/2019 showed mild proximal-mid LAD stenosis with CAC 48.9 Agatston units (98th percentile).   Cardiac MRI in 10/2020 showed EF 26%, moderate LV dilation, RV EF 44%, nonspecific RV insertion site LGE.   She recently returned for followup of CHF with Dr. Shirlee Latch on 01/13/21. Weight was up about 5 lbs. She saw Dr. Lalla Brothers and was thinking about ICD. She walked for exercise on most days. She was short of breath only with heavy exertion. No chest pain. No lightheadedness. BP was high, SBP in 120s at home. She felt swollen/edematous on days that she did not take furosemide (every other day).   Today he returns to HF clinic for pharmacist medication titration. At last visit with MD, carvedilol was increased to 25 mg BID and furosemide increased to 20 mg daily.   142/80, 68, 259 lbs - need BMET - BP creeping back up, 120-140s since last year A) Hydral 25 tid + Imdur 15? - Didn't tolerate Farxiga due to HA and UTI  F/u: could do 1 month if can titrate hydral/imdur, otherwise 5/27 with DM  Overall feeling ***. Dizziness,  lightheadedness, fatigue:  Chest pain or palpitations:  How is your breathing?: *** SOB: Able to complete all ADLs. Activity level ***  Weight at home pounds. Takes furosemide/torsemide/bumex *** mg *** daily.  LEE PND/Orthopnea  Appetite *** Low-salt diet:   Physical Exam Cost/affordability of meds  HF Medications: Carvedilol 25 mg BID Entresto 97/103 mg BID Spironolactone 25 mg daily Furosemide 20 mg daily  Has the patient been experiencing any side effects to the medications prescribed?  {YES NO:22349}  Does the patient have any problems obtaining medications due to transportation or finances?   {YES FV:49449} - Has [employer]  Understanding of regimen: {excellent/good/fair/poor:19665} Understanding of indications: {excellent/good/fair/poor:19665} Potential of compliance: {excellent/good/fair/poor:19665} Patient understands to avoid NSAIDs. Patient understands to avoid decongestants.    Pertinent Lab Values: . Serum creatinine ***, BUN ***, Potassium ***, Sodium ***, BNP ***, Magnesium ***, Digoxin ***   Vital Signs: . Weight: *** (last clinic weight: ***) . Blood pressure: ***  . Heart rate: ***   Assessment/Plan: 1. Chronic systolic CHF: Diagnosed after delivery of child in 02/2012, thought initially to be peri-partum cardiomyopathy. EF improved from 20-25% to 45-50% by 10/13. COVID-19 infection in 9/20, ongoing dyspnea prompted an echo, which showed EF 20% in 11/20. Question if this is a new process, COVID-19 myocarditis? She actually has age-advanced coronary disease on coronary CTA but no obstructive disease.  No family history of cardiomyopathy. No substance abuse. Cardiac MRI in 12/21 showed EF 26% with moderate LV dilation, near normal RV function,  and nonspecific RV insertion site LGE.   - NYHA class II, not volume overloaded but she does feel swollen on days she does not take furosemide - Continue furosemide 20 mg daily - Continue carvedilol 25 mg BID -  Continue Entresto 97/103 BID - Continue spironolactone 25 daily  - She was unable to tolerate dapagliflozin due to headache and UTI.  - She has seen EP to discuss ICD, still thinking about it.  Given her young age and persistently low EF, previously recommended that she get an ICD.  Not CRT candidate.  - Follow-up ***  2. HTN: BP well-controlled at home.   3. Coronary artery disease: Non-obstructive disease on 1/21 CTA but age-advanced.   - Continue rosuvastatin 10 mg daily.   4. Obesity: We discussed diet/exercise for weight loss.   Tama Headings, PharmD, BCPS PGY2 Cardiology Pharmacy Resident  Karle Plumber, PharmD, BCPS, [facility], CPP Heart Failure Clinic Pharmacist [phone]

## 2021-02-09 ENCOUNTER — Other Ambulatory Visit: Payer: Self-pay

## 2021-02-09 ENCOUNTER — Ambulatory Visit (HOSPITAL_COMMUNITY)
Admission: RE | Admit: 2021-02-09 | Discharge: 2021-02-09 | Disposition: A | Discharge status: Home / Self Care | Payer: BC Managed Care – PPO | Source: Ambulatory Visit | Attending: Cardiology | Admitting: Cardiology

## 2021-02-09 VITALS — BP 138/80 | HR 58 | Wt 260.4 lb

## 2021-02-09 DIAGNOSIS — E669 Obesity, unspecified: Secondary | ICD-10-CM | POA: Diagnosis not present

## 2021-02-09 DIAGNOSIS — Z6841 Body Mass Index (BMI) 40.0 and over, adult: Secondary | ICD-10-CM | POA: Insufficient documentation

## 2021-02-09 DIAGNOSIS — I110 Hypertensive heart disease with heart failure: Secondary | ICD-10-CM | POA: Diagnosis not present

## 2021-02-09 DIAGNOSIS — I5022 Chronic systolic (congestive) heart failure: Secondary | ICD-10-CM | POA: Diagnosis present

## 2021-02-09 DIAGNOSIS — Z8616 Personal history of COVID-19: Secondary | ICD-10-CM | POA: Insufficient documentation

## 2021-02-09 DIAGNOSIS — I2510 Atherosclerotic heart disease of native coronary artery without angina pectoris: Secondary | ICD-10-CM | POA: Insufficient documentation

## 2021-02-09 MED ORDER — FUROSEMIDE 20 MG PO TABS: 20.0000 mg | ORAL_TABLET | Freq: Every day | ORAL | 3 refills | Status: AC | PRN

## 2021-02-09 MED ORDER — HYDRALAZINE HCL 25 MG PO TABS: 25.0000 mg | ORAL_TABLET | Freq: Three times a day (TID) | ORAL | 11 refills | Status: DC

## 2021-02-09 MED ORDER — ISOSORBIDE MONONITRATE ER 30 MG PO TB24: 15.0000 mg | ORAL_TABLET | Freq: Every day | ORAL | 11 refills | Status: DC

## 2021-02-09 NOTE — Progress Notes (Signed)
PCP: McLean-Scocuzza, Pasty Spillers, MD HF Cardiology: Dr. Shirlee Latch  HPI:  35 y.o. with history of HTN and CHF was referred by Ward Givens, NP for evaluation of CHF. Patient was first diagnosed with CHF in 02/2012, about 6 months after delivering her first and only child. LVEF at that time was 20-25% by echo. With medical management, it had improved to 45-50% by 08/2012. 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 She was doing ok after that until 07/2019, when she developed COVID-19 infection. She had respiratory symptoms but recovered after a week or so. However, she did not feel like she got back to normal. In 09/2019, she began to notice very significant exertional dyspnea and orthopnea. Echo was done in 09/2019, showing EF back down to 20% with severe LV dilation, normal RV. She was started on furosemide and subsequently Entresto, carvedilol, and spironolactone. Coronary CTA in 11/2019 showed mild proximal-mid LAD stenosis with CAC 48.9 Agatston units (98th percentile).   Cardiac MRI in 10/2020 showed EF 26%, moderate LV dilation, RV EF 44%, nonspecific RV insertion site LGE.   She recently returned for followup of CHF with Dr. Shirlee Latch on 01/13/21. Weight was up about 5 lbs. She saw Dr. Lalla Brothers and was thinking about ICD. She walked for exercise on most days. She was short of breath only with heavy exertion. No chest pain. No lightheadedness. BP was high, SBP in 120s at home. She felt swollen/edematous on days that she did not take furosemide (every other day).   Today she returns to HF clinic for pharmacist medication titration. At last visit with MD, carvedilol was increased to 25 mg BID and furosemide increased to 20 mg daily. Today, she is feeling fine overall. She reports that she ran out of her furosemide 1 week ago, but her volume status has been fine without furosemide. Notes that she had edema in her hands on Saturday morning, but this went away after she took [place]. No PND/Orthopnea. Denies dizziness  or lightheadedness. She has some fatigue but reports feeling much better since her last visit. Denies SOB. Walks 3 miles daily. Appetite is good, does not adhere to low-salt diet as much as she should. Does not have a scale but will get one this week.   HF Medications: Carvedilol 25 mg BID Entresto 97/103 mg BID Spironolactone 25 mg daily Furosemide 20 mg daily- ran out 1 week ago  Has the patient been experiencing any side effects to the medications prescribed?  no  Does the patient have any problems obtaining medications due to transportation or finances?   no - Has [employer]. She is able to afford Entresto currently, but she currently pays $43/mo.  Understanding of regimen: good Understanding of indications: good Potential of compliance: good Patient understands to avoid NSAIDs. Patient understands to avoid decongestants.    Pertinent Lab Values (01/13/21): Marland Kitchen Serum creatinine 0.9, BUN 10, Potassium 4.2, Sodium 137  Vital Signs: . Weight: 260.4 lbs (last clinic weight: 259 lbs) . Blood pressure: 138/80  . Heart rate: 58   Assessment/Plan: 1. Chronic systolic CHF: Diagnosed after delivery of child in 02/2012, thought initially to be peri-partum cardiomyopathy. EF improved from 20-25% to 45-50% by 10/13. COVID-19 infection in 9/20, ongoing dyspnea prompted an echo, which showed EF 20% in 11/20. Question if this is a new process, COVID-19 myocarditis? She actually has age-advanced coronary disease on coronary CTA but no obstructive disease.  No family history of cardiomyopathy. No substance abuse. Cardiac MRI in 12/21 showed EF 26% with moderate  LV dilation, near normal RV function, and nonspecific RV insertion site LGE.   - NYHA class II, euvolemic on exam after not taking furosemide for 1 week - Change furosemide to 20 mg daily PRN - Continue carvedilol 25 mg BID - Continue Entresto 97/103 BID. Gave patient instructions on how to apply previous Entresto $10 copay card  to current prescription. - Continue spironolactone 25 daily  - Start hydralazine 25 mg TID and isosorbide mononitrate 15 mg daily - She was unable to tolerate dapagliflozin due to headache and UTI.  - She has seen EP to discuss ICD, still thinking about it.  Given her young age and persistently low EF, previously recommended that she get an ICD.  Not CRT candidate.  - Follow-up in 2 months with Dr. Shirlee Latch  2. HTN: BP remains elevated. - Start hydralazine as above  3. Coronary artery disease: Non-obstructive disease on 1/21 CTA but age-advanced.   - Continue rosuvastatin 10 mg daily.   4. Obesity: We discussed diet/exercise for weight loss. Patient now walking 3 miles daily.  Tama Headings, PharmD, BCPS PGY2 Cardiology Pharmacy Resident  Karle Plumber, PharmD, BCPS, [facility], CPP Heart Failure Clinic Pharmacist [phone]

## 2021-02-09 NOTE — Patient Instructions (Signed)
It was a pleasure seeing you today!  MEDICATIONS: -We are changing your medications today -Start hydralazine 25 mg (1 tablet) three times daily -Start isosorbide mononitrate 15 mg (1/2 tablet) once daily -Change furosemide to 20 mg (1 tablet) daily as needed for fluid overload -Call if you have questions about your medications.  NEXT APPOINTMENT: Return to clinic in 2 months with Dr. Shirlee Latch.  In general, to take care of your heart failure: -Limit your fluid intake to 2 Liters (half-gallon) per day.   -Limit your salt intake to ideally 2-3 grams (2000-3000 mg) per day. -Weigh yourself daily and record, and bring that "weight diary" to your next appointment.  (Weight gain of 2-3 pounds in 1 day typically means fluid weight.) -The medications for your heart are to help your heart and help you live longer.   -Please contact us before stopping any of your heart medications.  Call the clinic at [phone] with questions or to reschedule future appointments.

## 2021-02-11 ENCOUNTER — Encounter: Payer: Self-pay | Admitting: Internal Medicine

## 2021-02-11 ENCOUNTER — Encounter (HOSPITAL_COMMUNITY): Payer: Self-pay

## 2021-02-17 NOTE — Addendum Note (Signed)
Addended by: Quentin Ore on: 02/17/2021 05:14 PM   Modules accepted: Orders

## 2021-03-13 ENCOUNTER — Telehealth: Payer: Self-pay | Admitting: Internal Medicine

## 2021-03-13 NOTE — Telephone Encounter (Signed)
lft pt vm to call ofc regarding following up on referral. Thanks

## 2021-03-20 ENCOUNTER — Telehealth: Payer: Self-pay | Admitting: Internal Medicine

## 2021-03-20 NOTE — Telephone Encounter (Signed)
lft pt vm to follow up on referral to [facility] cardiology. thanks

## 2021-04-17 ENCOUNTER — Encounter (HOSPITAL_COMMUNITY): Payer: BC Managed Care – PPO | Admitting: Cardiology

## 2021-11-06 ENCOUNTER — Ambulatory Visit: Payer: BC Managed Care – PPO | Admitting: Internal Medicine

## 2021-11-06 ENCOUNTER — Other Ambulatory Visit: Payer: Self-pay

## 2021-11-06 ENCOUNTER — Other Ambulatory Visit (HOSPITAL_COMMUNITY)
Admission: RE | Admit: 2021-11-06 | Discharge: 2021-11-06 | Disposition: A | Discharge status: Home / Self Care | Payer: BC Managed Care – PPO | Source: Ambulatory Visit | Attending: Internal Medicine | Admitting: Internal Medicine

## 2021-11-06 ENCOUNTER — Encounter: Payer: Self-pay | Admitting: Internal Medicine

## 2021-11-06 VITALS — BP 132/88 | HR 77 | Temp 97.1°F | Ht 66.0 in | Wt 273.4 lb

## 2021-11-06 DIAGNOSIS — I517 Cardiomegaly: Secondary | ICD-10-CM | POA: Diagnosis not present

## 2021-11-06 DIAGNOSIS — J9801 Acute bronchospasm: Secondary | ICD-10-CM

## 2021-11-06 DIAGNOSIS — J0110 Acute frontal sinusitis, unspecified: Secondary | ICD-10-CM

## 2021-11-06 DIAGNOSIS — I5022 Chronic systolic (congestive) heart failure: Secondary | ICD-10-CM | POA: Diagnosis not present

## 2021-11-06 DIAGNOSIS — I428 Other cardiomyopathies: Secondary | ICD-10-CM

## 2021-11-06 DIAGNOSIS — Z124 Encounter for screening for malignant neoplasm of cervix: Secondary | ICD-10-CM

## 2021-11-06 DIAGNOSIS — R739 Hyperglycemia, unspecified: Secondary | ICD-10-CM

## 2021-11-06 DIAGNOSIS — I10 Essential (primary) hypertension: Secondary | ICD-10-CM

## 2021-11-06 DIAGNOSIS — Z0001 Encounter for general adult medical examination with abnormal findings: Secondary | ICD-10-CM | POA: Diagnosis not present

## 2021-11-06 DIAGNOSIS — E559 Vitamin D deficiency, unspecified: Secondary | ICD-10-CM

## 2021-11-06 DIAGNOSIS — E611 Iron deficiency: Secondary | ICD-10-CM

## 2021-11-06 DIAGNOSIS — Z1329 Encounter for screening for other suspected endocrine disorder: Secondary | ICD-10-CM

## 2021-11-06 DIAGNOSIS — Z1389 Encounter for screening for other disorder: Secondary | ICD-10-CM

## 2021-11-06 MED ORDER — ALBUTEROL SULFATE HFA 108 (90 BASE) MCG/ACT IN AERS: 1.0000 | INHALATION_SPRAY | Freq: Four times a day (QID) | RESPIRATORY_TRACT | 12 refills | Status: AC | PRN

## 2021-11-06 MED ORDER — AZITHROMYCIN 250 MG PO TABS: ORAL_TABLET | ORAL | 0 refills | Status: AC

## 2021-11-06 NOTE — Progress Notes (Signed)
Chief Complaint  Patient presents with   Annual Exam   Annual  1. Pap today  2. Back spasms resolved  3. CHF coreg 25 mg bid entresto 97-103 bid, spironolactone 25 mg bid lasix 20 mg qd they want her to be on jardiance but cost is an issue  Cards appt [facility] 12/01/21 cards and ep appt due 12/2020  4. C/o sinus pain frontal nothing tried   Review of Systems  Constitutional:  Negative for weight loss.  HENT:  Negative for hearing loss.   Eyes:  Negative for blurred vision.  Respiratory:  Negative for shortness of breath.   Cardiovascular:  Negative for chest pain.  Gastrointestinal:  Negative for abdominal pain and blood in stool.  Genitourinary:  Negative for dysuria.  Musculoskeletal:  Negative for falls and joint pain.  Skin:  Negative for rash.  Neurological:  Negative for headaches.  Psychiatric/Behavioral:  Negative for depression.   Past Medical History:  Diagnosis Date   Bronchitis    11/2017    Chicken pox    Chronic combined systolic (congestive) and diastolic (congestive) heart failure ([place]) 03/14/2012   a. 02/2012 Echo: EF 20-25%, diff HK, Gr2 DD; b. 08/2012 Echo: EF 45-5%, mild LVH; c. 09/2019 Echo: EF 20%, sev dil LV, glob HK. Nl RV fxn. Mod dil LA.   COVID-19 virus infection 07/2019   Hypertensive heart disease    Hypertensive NICM (nonischemic cardiomyopathy) ([place])    a. 02/2012 Echo: EF 20-25%, diff HK, Gr2 DD; b. 03/2012 ETT: Ex time 6:11, max HR 171, No ST/T changes; c. 08/2012 Echo: EF 45-50%, mild LVH; d. 09/2019 Echo: EF 20%, sev dil LV, glob HK. Nl RV fxn. Mod dil LA.   Maternal anemia complicating pregnancy, childbirth, or the puerperium 09/22/2011   Morbid obesity ([place])    Nonobstructive CAD (coronary artery disease)    a. 11/2019 Cor CTA: Cor Ca2+ 48.9 (98th%'ile). LM nl, LAD nl, LCX nl, RCA <50% prox/mid mixed plaque.   Past Surgical History:  Procedure Laterality Date   CESAREAN SECTION  09/21/2011   Procedure: CESAREAN SECTION;  Surgeon: Agnes Lawrence, MD;  Location: [facility];  Service: Gynecology;  Laterality: N/A;   FINGER SURGERY     right thumb repair 2006 playing Basketball    Family History  Problem Relation Age of Onset   Hypertension Mother    Diabetes type II Mother    Diabetes Mother    Hypertension Father    Hyperlipidemia Father    Depression Maternal Grandmother    Stroke Paternal Grandmother    Anesthesia problems Neg Hx    Hypotension Neg Hx    Malignant hyperthermia Neg Hx    Pseudochol deficiency Neg Hx    Heart attack Neg Hx    Social History   Socioeconomic History   Marital status: Single    Spouse name: Not on file   Number of children: 1   Years of education: Not on file   Highest education level: Not on file  Occupational History   Not on file  Tobacco Use   Smoking status: Never   Smokeless tobacco: Never  Vaping Use   Vaping Use: Never used  Substance and Sexual Activity   Alcohol use: No   Drug use: No   Sexual activity: Yes  Other Topics Concern   Not on file  Social History Narrative   Lives in [place], [place] with boyfriend.    1 son 92 y.o    Works at [employer]  Wears seatbelt    Feels safe in relationship    No guns    Social Determinants of Health   Financial Resource Strain: Not on file  Food Insecurity: Not on file  Transportation Needs: Not on file  Physical Activity: Not on file  Stress: Not on file  Social Connections: Not on file  Intimate Partner Violence: Not on file   Current Meds  Medication Sig   azithromycin (ZITHROMAX) 250 MG tablet Take 2 tablets on day 1, then 1 tablet daily on days 2 through 5   carvedilol (COREG) 25 MG tablet Take 1 tablet (25 mg total) by mouth 2 (two) times daily with a meal.   cetirizine (ZYRTEC) 10 MG tablet Take 1 tablet (10 mg total) by mouth at bedtime as needed for allergies.   furosemide (LASIX) 20 MG tablet Take 1 tablet (20 mg total) by mouth daily as needed for fluid or edema.   rosuvastatin (CRESTOR) 10 MG tablet  Take 1 tablet (10 mg total) by mouth daily.   sacubitril-valsartan (ENTRESTO) 97-103 MG Take 1 tablet by mouth 2 (two) times daily.   spironolactone (ALDACTONE) 25 MG tablet TAKE 1 TABLET BY MOUTH ONCE DAILY.   Allergies  Allergen Reactions   Darvocet [Propoxyphene N-Acetaminophen] Nausea And Vomiting   Hydralazine     H/a ?   Imdur [Isosorbide Nitrate]     H/a   No results found for this or any previous visit (from the past 2160 hour(s)). Objective  Body mass index is 44.13 kg/m. Wt Readings from Last 3 Encounters:  11/06/21 273 lb 6.4 oz (124 kg)  02/09/21 260 lb 6.4 oz (118.1 kg)  01/13/21 259 lb 6.4 oz (117.7 kg)   Temp Readings from Last 3 Encounters:  11/06/21 (!) 97.1 F (36.2 C) (Temporal)  11/02/19 (!) 96.6 F (35.9 C)  10/16/19 (!) 97.1 F (36.2 C)   BP Readings from Last 3 Encounters:  11/06/21 132/88  02/09/21 138/80  01/13/21 (!) 142/80   Pulse Readings from Last 3 Encounters:  11/06/21 77  02/09/21 (!) 58  01/13/21 68    Physical Exam Vitals and nursing note reviewed.  Constitutional:      Appearance: Normal appearance. She is well-developed and well-groomed.  HENT:     Head: Normocephalic and atraumatic.  Eyes:     Conjunctiva/sclera: Conjunctivae normal.     Pupils: Pupils are equal, round, and reactive to light.  Cardiovascular:     Rate and Rhythm: Normal rate and regular rhythm.     Heart sounds: Normal heart sounds. No murmur heard. Pulmonary:     Effort: Pulmonary effort is normal.     Breath sounds: Normal breath sounds.  Abdominal:     General: Abdomen is flat. Bowel sounds are normal.     Tenderness: There is no abdominal tenderness.  Genitourinary:    Pubic Area: No rash.      Labia:        Right: No rash.        Left: No rash.      Vagina: Normal.     Cervix: Discharge present.     Uterus: Normal.      Adnexa: Right adnexa normal and left adnexa normal.  Musculoskeletal:        General: No tenderness.  Skin:    General:  Skin is warm and dry.  Neurological:     General: No focal deficit present.     Mental Status: She is alert and oriented to person,  place, and time. Mental status is at baseline.     Cranial Nerves: Cranial nerves 2-12 are intact.     Gait: Gait is intact.  Psychiatric:        Attention and Perception: Attention and perception normal.        Mood and Affect: Mood and affect normal.        Speech: Speech normal.        Behavior: Behavior normal. Behavior is cooperative.        Thought Content: Thought content normal.        Cognition and Memory: Cognition and memory normal.        Judgment: Judgment normal.    Assessment  Plan  Annual physical exam - Plan: Comprehensive metabolic panel, Lipid panel, CBC with Differential/Platelet, Hemoglobin A1c, TSH, Vitamin D (25 hydroxy), Magnesium See below   Chronic systolic heart failure (HCC) Cardiomegaly NICM (nonischemic cardiomyopathy) ([place]) Primary hypertension - Plan: Comprehensive metabolic panel, Lipid panel, CBC with Differential/Platelet coreg 25 mg bid entresto 97-103 bid, spironolactone 25 mg bid lasix 20 mg qd they want her to be on jardiance but cost is an issue   Acute frontal sinusitis, recurrence not specified - Plan: azithromycin (ZITHROMAX) 250 MG tablet   sch fasting labs today at [facility] given forms  Had flu shot late 11/(3 or 4)/2022 at work Tdap 09/2017  Hep B immune  Pap at f/u  Covid shots had moderna 2/2  Had polio vaccines, MMR, Hib  colonoscopy 29  Mammo age 38  Pap today   Chronic systolic heart failure ([place]) - Plan: Basic Metabolic Panel (BMET), Lipid panel, spironolactone (ALDACTONE) 25 MG tablet F/u with CHF clinic today in [place] F/u [facility] cards 12/01/21 and 12/2021 EP Dr Willis Modena they rec ICD pt still deciding disc with her I agree with plans had consult with [facility] as well and they rec icd as well  Consider jardiance per cards work with [facility] to get meds due to cost  Provider: Dr. Olivia Mackie McLean-Scocuzza-Internal  Medicine

## 2021-11-06 NOTE — Patient Instructions (Addendum)
06/23/2021 Office Visit [facility]   [address]   [place], [place] [address]   [phone]   Dorcas Carrow, MD   [address]   [address]   [place], [place] [address]   [phone] (Work)   [phone] (Fax)   Non-ischemic cardiomyopathy (CMS-HCC) (Primary Dx)  [place] Pepper ask pharmacist and Dr. Herbert Deaner and ask where is this medication or can you get financial help with it  Consider moderna total 4 doses    of Treatment - Upcoming Encounters Upcoming Encounters Date Type Specialty Care Team Description  12/01/2021 Office Visit Cardiology Liborio Nixon, MD   [address]   [id number]   [place], [place] [address]   [phone] (Work)   [phone] (Fax)       Nasal saline  then Flonase or nasacort   Allergy pill claritin, allergra, xyzal, zyrtec  Tylenol as needed  No advil, ibuprofen, sudafed Sinusitis, Adult Sinusitis is inflammation of your sinuses. Sinuses are hollow spaces in the bones around your face. Your sinuses are located: Around your eyes. In the middle of your forehead. Behind your nose. In your cheekbones. Mucus normally drains out of your sinuses. When your nasal tissues become inflamed or swollen, mucus can become trapped or blocked. This allows bacteria, viruses, and fungi to grow, which leads to infection. Most infections of the sinuses are caused by a virus. Sinusitis can develop quickly. It can last for up to 4 weeks (acute) or for more than 12 weeks (chronic). Sinusitis often develops after a cold. What are the causes? This condition is caused by anything that creates swelling in the sinuses or stops mucus from draining. This includes: Allergies. Asthma. Infection from bacteria or viruses. Deformities or blockages in your nose or sinuses. Abnormal growths in the nose (nasal polyps). Pollutants, such as chemicals or irritants in the air. Infection from fungi (rare). What increases the  risk? You are more likely to develop this condition if you: Have a weak body defense system (immune system). Do a lot of swimming or diving. Overuse nasal sprays. Smoke. What are the signs or symptoms? The main symptoms of this condition are pain and a feeling of pressure around the affected sinuses. Other symptoms include: Stuffy nose or congestion. Thick drainage from your nose. Swelling and warmth over the affected sinuses. Headache. Upper toothache. A cough that may get worse at night. Extra mucus that collects in the throat or the back of the nose (postnasal drip). Decreased sense of smell and taste. Fatigue. A fever. Sore throat. Bad breath. How is this diagnosed? This condition is diagnosed based on: Your symptoms. Your medical history. A physical exam. Tests to find out if your condition is acute or chronic. This may include: Checking your nose for nasal polyps. Viewing your sinuses using a device that has a light (endoscope). Testing for allergies or bacteria. Imaging tests, such as an MRI or CT scan. In rare cases, a bone biopsy may be done to rule out more serious types of fungal sinus disease. How is this treated? Treatment for sinusitis depends on the cause and whether your condition is chronic or acute. If caused by a virus, your symptoms should go away on their own within 10 days. You may be given medicines to relieve symptoms. They include: Medicines that shrink swollen nasal passages (topical intranasal decongestants). Medicines that treat allergies (antihistamines). A spray that eases inflammation of the nostrils (topical intranasal corticosteroids). Rinses that help get rid  of thick mucus in your nose (nasal saline washes). If caused by bacteria, your health care provider may recommend waiting to see if your symptoms improve. Most bacterial infections will get better without antibiotic medicine. You may be given antibiotics if you have: A severe infection. A  weak immune system. If caused by narrow nasal passages or nasal polyps, you may need to have surgery. Follow these instructions at home: Medicines Take, use, or apply over-the-counter and prescription medicines only as told by your health care provider. These may include nasal sprays. If you were prescribed an antibiotic medicine, take it as told by your health care provider. Do not stop taking the antibiotic even if you start to feel better. Hydrate and humidify  Drink enough fluid to keep your urine pale yellow. Staying hydrated will help to thin your mucus. Use a cool mist humidifier to keep the humidity level in your home above 50%. Inhale steam for 10-15 minutes, 3-4 times a day, or as told by your health care provider. You can do this in the bathroom while a hot shower is running. Limit your exposure to cool or dry air. Rest Rest as much as possible. Sleep with your head raised (elevated). Make sure you get enough sleep each night. General instructions  Apply a warm, moist washcloth to your face 3-4 times a day or as told by your health care provider. This will help with discomfort. Wash your hands often with soap and water to reduce your exposure to germs. If soap and water are not available, use hand sanitizer. Do not smoke. Avoid being around people who are smoking (secondhand smoke). Keep all follow-up visits as told by your health care provider. This is important. Contact a health care provider if: You have a fever. Your symptoms get worse. Your symptoms do not improve within 10 days. Get help right away if: You have a severe headache. You have persistent vomiting. You have severe pain or swelling around your face or eyes. You have vision problems. You develop confusion. Your neck is stiff. You have trouble breathing. Summary Sinusitis is soreness and inflammation of your sinuses. Sinuses are hollow spaces in the bones around your face. This condition is caused by nasal  tissues that become inflamed or swollen. The swelling traps or blocks the flow of mucus. This allows bacteria, viruses, and fungi to grow, which leads to infection. If you were prescribed an antibiotic medicine, take it as told by your health care provider. Do not stop taking the antibiotic even if you start to feel better. Keep all follow-up visits as told by your health care provider. This is important. This information is not intended to replace advice given to you by your health care provider. Make sure you discuss any questions you have with your health care provider. Document Revised: 04/10/2018 Document Reviewed: 04/10/2018 Elsevier Patient Education  2022 [employer].

## 2021-11-11 LAB — CYTOLOGY - PAP
Comment: NEGATIVE
Diagnosis: REACTIVE
High risk HPV: NEGATIVE

## 2022-02-15 ENCOUNTER — Encounter: Payer: Self-pay | Admitting: Internal Medicine

## 2022-02-17 ENCOUNTER — Other Ambulatory Visit: Payer: Self-pay | Admitting: Internal Medicine

## 2022-02-17 ENCOUNTER — Encounter: Payer: Self-pay | Admitting: Internal Medicine

## 2022-02-17 ENCOUNTER — Ambulatory Visit: Payer: BC Managed Care – PPO | Admitting: Internal Medicine

## 2022-02-17 ENCOUNTER — Other Ambulatory Visit: Payer: Self-pay

## 2022-02-17 VITALS — BP 110/78 | HR 65 | Temp 98.0°F | Ht 66.0 in | Wt 274.4 lb

## 2022-02-17 DIAGNOSIS — Z1329 Encounter for screening for other suspected endocrine disorder: Secondary | ICD-10-CM | POA: Diagnosis not present

## 2022-02-17 DIAGNOSIS — H918X1 Other specified hearing loss, right ear: Secondary | ICD-10-CM

## 2022-02-17 DIAGNOSIS — E559 Vitamin D deficiency, unspecified: Secondary | ICD-10-CM | POA: Diagnosis not present

## 2022-02-17 DIAGNOSIS — I5022 Chronic systolic (congestive) heart failure: Secondary | ICD-10-CM | POA: Diagnosis not present

## 2022-02-17 DIAGNOSIS — R7303 Prediabetes: Secondary | ICD-10-CM | POA: Insufficient documentation

## 2022-02-17 DIAGNOSIS — H9203 Otalgia, bilateral: Secondary | ICD-10-CM

## 2022-02-17 DIAGNOSIS — R790 Abnormal level of blood mineral: Secondary | ICD-10-CM | POA: Diagnosis not present

## 2022-02-17 DIAGNOSIS — R739 Hyperglycemia, unspecified: Secondary | ICD-10-CM

## 2022-02-17 DIAGNOSIS — H6123 Impacted cerumen, bilateral: Secondary | ICD-10-CM

## 2022-02-17 DIAGNOSIS — E611 Iron deficiency: Secondary | ICD-10-CM | POA: Insufficient documentation

## 2022-02-17 DIAGNOSIS — Z1389 Encounter for screening for other disorder: Secondary | ICD-10-CM

## 2022-02-17 DIAGNOSIS — E785 Hyperlipidemia, unspecified: Secondary | ICD-10-CM | POA: Diagnosis not present

## 2022-02-17 DIAGNOSIS — I10 Essential (primary) hypertension: Secondary | ICD-10-CM | POA: Diagnosis not present

## 2022-02-17 LAB — CBC WITH DIFFERENTIAL/PLATELET
Basophils Absolute: 0.1 10*3/uL (ref 0.0–0.1)
Basophils Relative: 0.9 % (ref 0.0–3.0)
Eosinophils Absolute: 0.2 10*3/uL (ref 0.0–0.7)
Eosinophils Relative: 2.9 % (ref 0.0–5.0)
HCT: 39.6 % (ref 36.0–46.0)
Hemoglobin: 12.9 g/dL (ref 12.0–15.0)
Lymphocytes Relative: 34.6 % (ref 12.0–46.0)
Lymphs Abs: 2.4 10*3/uL (ref 0.7–4.0)
MCHC: 32.4 g/dL (ref 30.0–36.0)
MCV: 81.6 fl (ref 78.0–100.0)
Monocytes Absolute: 0.6 10*3/uL (ref 0.1–1.0)
Monocytes Relative: 8.1 % (ref 3.0–12.0)
Neutro Abs: 3.7 10*3/uL (ref 1.4–7.7)
Neutrophils Relative %: 53.5 % (ref 43.0–77.0)
Platelets: 325 10*3/uL (ref 150.0–400.0)
RBC: 4.86 Mil/uL (ref 3.87–5.11)
RDW: 13.6 % (ref 11.5–15.5)
WBC: 6.8 10*3/uL (ref 4.0–10.5)

## 2022-02-17 LAB — LIPID PANEL
Cholesterol: 235 mg/dL — ABNORMAL HIGH (ref 0–200)
HDL: 53 mg/dL (ref >39.00)
LDL Cholesterol: 162 mg/dL — ABNORMAL HIGH (ref 0–99)
NonHDL: 181.7
Total CHOL/HDL Ratio: 4
Triglycerides: 101 mg/dL (ref 0.0–149.0)
VLDL: 20.2 mg/dL (ref 0.0–40.0)

## 2022-02-17 LAB — HEMOGLOBIN A1C: Hgb A1c MFr Bld: 6.1 % (ref 4.6–6.5)

## 2022-02-17 LAB — MAGNESIUM: Magnesium: 1.9 mg/dL (ref 1.5–2.5)

## 2022-02-17 LAB — TSH: TSH: 1.04 u[IU]/mL (ref 0.35–5.50)

## 2022-02-17 LAB — IBC + FERRITIN
Ferritin: 16.9 ng/mL (ref 10.0–291.0)
Iron: 49 ug/dL (ref 42–145)
Saturation Ratios: 11.7 % — ABNORMAL LOW (ref 20.0–50.0)
TIBC: 417.2 ug/dL (ref 250.0–450.0)
Transferrin: 298 mg/dL (ref 212.0–360.0)

## 2022-02-17 LAB — VITAMIN D 25 HYDROXY (VIT D DEFICIENCY, FRACTURES): VITD: 11.16 ng/mL — ABNORMAL LOW (ref 30.00–100.00)

## 2022-02-17 MED ORDER — NEOMYCIN-POLYMYXIN-HC 3.5-10000-1 OT SOLN
3.0000 [drp] | Freq: Three times a day (TID) | OTIC | 0 refills | Status: AC
Start: 2022-02-17 — End: ?

## 2022-02-17 MED ORDER — CHOLECALCIFEROL 1.25 MG (50000 UT) PO CAPS: 50000.0000 [IU] | ORAL_CAPSULE | ORAL | 1 refills | Status: DC

## 2022-02-17 NOTE — Patient Instructions (Signed)
[facility]   ?[place] [place]   ?[place], [place] [address]   ?[id number]   Melchor Amour, MD   ?[place]   ?[id number]   ?[place], [place] [address]   ?[phone] (Work)   ?[phone] (Fax)    ? ?

## 2022-02-17 NOTE — Progress Notes (Signed)
Chief Complaint  ?Patient presents with  ? Ear Pain  ? ?F/u  ?1. Left ear pain x last week used debrox ear drops x 2 weeks then lost hearing and ears itching and scratchy and full and pain at night with itching  ?2. Labs overdue fasting today see visit 11/06/21 ? ?Review of Systems  ?HENT:  Positive for ear pain and hearing loss.   ?Past Medical History:  ?Diagnosis Date  ? Bronchitis   ? 11/2017   ? Chicken pox   ? Chronic combined systolic (congestive) and diastolic (congestive) heart failure ([place]) 03/14/2012  ? a. 02/2012 Echo: EF 20-25%, diff HK, Gr2 DD; b. 08/2012 Echo: EF 45-5%, mild LVH; c. 09/2019 Echo: EF 20%, sev dil LV, glob HK. Nl RV fxn. Mod dil LA.  ? COVID-19 virus infection 07/2019  ? Hypertensive heart disease   ? Hypertensive NICM (nonischemic cardiomyopathy) ([place])   ? a. 02/2012 Echo: EF 20-25%, diff HK, Gr2 DD; b. 03/2012 ETT: Ex time 6:11, max HR 171, No ST/T changes; c. 08/2012 Echo: EF 45-50%, mild LVH; d. 09/2019 Echo: EF 20%, sev dil LV, glob HK. Nl RV fxn. Mod dil LA.  ? Maternal anemia complicating pregnancy, childbirth, or the puerperium 09/22/2011  ? Morbid obesity ([place])   ? Nonobstructive CAD (coronary artery disease)   ? a. 11/2019 Cor CTA: Cor Ca2+ 48.9 (98th%'ile). LM nl, LAD nl, LCX nl, RCA <50% prox/mid mixed plaque.  ? ?Past Surgical History:  ?Procedure Laterality Date  ? CESAREAN SECTION  09/21/2011  ? Procedure: CESAREAN SECTION;  Surgeon: Agnes Lawrence, MD;  Location: [facility];  Service: Gynecology;  Laterality: N/A;  ? FINGER SURGERY    ? right thumb repair 2006 playing Basketball   ? ?Family History  ?Problem Relation Age of Onset  ? Hypertension Mother   ? Diabetes type II Mother   ? Diabetes Mother   ? Hypertension Father   ? Hyperlipidemia Father   ? Depression Maternal Grandmother   ? Stroke Paternal Grandmother   ? Anesthesia problems Neg Hx   ? Hypotension Neg Hx   ? Malignant hyperthermia Neg Hx   ? Pseudochol deficiency Neg Hx   ? Heart attack Neg Hx   ? ?Social  History  ? ?Socioeconomic History  ? Marital status: Single  ?  Spouse name: Not on file  ? Number of children: 1  ? Years of education: Not on file  ? Highest education level: Not on file  ?Occupational History  ? Not on file  ?Tobacco Use  ? Smoking status: Never  ? Smokeless tobacco: Never  ?Vaping Use  ? Vaping Use: Never used  ?Substance and Sexual Activity  ? Alcohol use: No  ? Drug use: No  ? Sexual activity: Yes  ?Other Topics Concern  ? Not on file  ?Social History Narrative  ? Lives in [place], [place] with boyfriend.   ? 1 son 19 y.o   ? Works at [employer]  ? Wears seatbelt   ? Feels safe in relationship   ? No guns   ? ?Social Determinants of Health  ? ?Financial Resource Strain: Not on file  ?Food Insecurity: Not on file  ?Transportation Needs: Not on file  ?Physical Activity: Not on file  ?Stress: Not on file  ?Social Connections: Not on file  ?Intimate Partner Violence: Not on file  ? ?Current Meds  ?Medication Sig  ? albuterol (VENTOLIN HFA) 108 (90 Base) MCG/ACT inhaler Inhale 1-2 puffs into the lungs every  6 (six) hours as needed for wheezing or shortness of breath.  ? carvedilol (COREG) 25 MG tablet Take 1 tablet (25 mg total) by mouth 2 (two) times daily with a meal.  ? cetirizine (ZYRTEC) 10 MG tablet Take 1 tablet (10 mg total) by mouth at bedtime as needed for allergies.  ? empagliflozin (JARDIANCE) 10 MG TABS tablet Take by mouth daily.  ? furosemide (LASIX) 20 MG tablet Take 1 tablet (20 mg total) by mouth daily as needed for fluid or edema.  ? neomycin-polymyxin-hydrocortisone (CORTISPORIN) OTIC solution Place 3 drops into both ears 3 (three) times daily. X4-7 days  ? rosuvastatin (CRESTOR) 10 MG tablet Take 1 tablet (10 mg total) by mouth daily.  ? sacubitril-valsartan (ENTRESTO) 97-103 MG Take 1 tablet by mouth 2 (two) times daily.  ? spironolactone (ALDACTONE) 25 MG tablet TAKE 1 TABLET BY MOUTH ONCE DAILY.  ? ?Allergies  ?Allergen Reactions  ? Darvocet [Propoxyphene N-Acetaminophen] Nausea And  Vomiting  ? Hydralazine   ?  H/a ?  ? Imdur [Isosorbide Nitrate]   ?  H/a  ? ?No results found for this or any previous visit (from the past 2160 hour(s)). ?Objective  ?Body mass index is 44.29 kg/m?. ?Wt Readings from Last 3 Encounters:  ?02/17/22 274 lb 6.4 oz (124.5 kg)  ?11/06/21 273 lb 6.4 oz (124 kg)  ?02/09/21 260 lb 6.4 oz (118.1 kg)  ? ?Temp Readings from Last 3 Encounters:  ?02/17/22 98 ?F (36.7 ?C) (Oral)  ?11/06/21 (!) 97.1 ?F (36.2 ?C) (Temporal)  ?11/02/19 (!) 96.6 ?F (35.9 ?C)  ? ?BP Readings from Last 3 Encounters:  ?02/17/22 110/78  ?11/06/21 132/88  ?02/09/21 138/80  ? ?Pulse Readings from Last 3 Encounters:  ?02/17/22 65  ?11/06/21 77  ?02/09/21 (!) 58  ? ? ?Physical Exam ?Vitals and nursing note reviewed.  ?Constitutional:   ?   Appearance: Normal appearance. She is well-developed and well-groomed.  ?HENT:  ?   Head: Normocephalic and atraumatic.  ?   Right Ear: There is impacted cerumen.  ?   Left Ear: There is impacted cerumen.  ?Eyes:  ?   Conjunctiva/sclera: Conjunctivae normal.  ?   Pupils: Pupils are equal, round, and reactive to light.  ?Cardiovascular:  ?   Rate and Rhythm: Normal rate and regular rhythm.  ?   Heart sounds: Normal heart sounds. No murmur heard. ?Pulmonary:  ?   Effort: Pulmonary effort is normal.  ?   Breath sounds: Normal breath sounds.  ?Abdominal:  ?   General: Abdomen is flat. Bowel sounds are normal.  ?   Tenderness: There is no abdominal tenderness.  ?Musculoskeletal:     ?   General: No tenderness.  ?Skin: ?   General: Skin is warm and dry.  ?Neurological:  ?   General: No focal deficit present.  ?   Mental Status: She is alert and oriented to person, place, and time. Mental status is at baseline.  ?   Cranial Nerves: Cranial nerves 2-12 are intact.  ?   Motor: Motor function is intact.  ?   Coordination: Coordination is intact.  ?   Gait: Gait is intact.  ?Psychiatric:     ?   Attention and Perception: Attention and perception normal.     ?   Mood and Affect: Mood  and affect normal.     ?   Speech: Speech normal.     ?   Behavior: Behavior normal. Behavior is cooperative.     ?  Thought Content: Thought content normal.     ?   Cognition and Memory: Cognition and memory normal.     ?   Judgment: Judgment normal.  ? ? ?Assessment  ?Plan  ?Bilateral impacted cerumen ?Other specified hearing loss of right ear, unspecified hearing status on contralateral side ?Otalgia of both ears - Plan: neomycin-polymyxin-hydrocortisone (CORTISPORIN) OTIC solution ?Consented and tolerated b/l ear wax removal right with currette and left with lavage and currette all was removed doing well  ? ?Chronic systolic heart failure ([place]) ?Call back [facility] for echo per pt costs $400 ? ?Primary hypertension - Plan: CBC with Differential/Platelet, Lipid panel ?Cont meds coreg 25 mg bid lasix 20, entresto, spironolactone 25 mg qd  ? ?HM ?sch fasting labs today a ?Had flu shot late 11/(3 or 4)/2022 at work ?Tdap 09/2017 check date ?Hep B immune  ?Pap 11/06/21 neg neg HPV ?Covid shots had moderna 2/2  ?Had polio vaccines, MMR, Hib ? colonoscopy 65  ?Mammo age 46  ?  ? ?Provider: Dr. Olivia Mackie McLean-Scocuzza-Internal Medicine  ?

## 2022-02-18 LAB — URINALYSIS, ROUTINE W REFLEX MICROSCOPIC
Bilirubin Urine: NEGATIVE
Hgb urine dipstick: NEGATIVE
Ketones, ur: NEGATIVE
Leukocytes,Ua: NEGATIVE
Nitrite: NEGATIVE
Protein, ur: NEGATIVE
Specific Gravity, Urine: 1.021 (ref 1.001–1.035)
pH: 6 (ref 5.0–8.0)

## 2022-05-07 ENCOUNTER — Ambulatory Visit: Payer: BC Managed Care – PPO | Admitting: Internal Medicine

## 2022-08-17 ENCOUNTER — Telehealth (INDEPENDENT_AMBULATORY_CARE_PROVIDER_SITE_OTHER): Payer: BC Managed Care – PPO | Admitting: Internal Medicine

## 2022-08-17 ENCOUNTER — Encounter: Payer: Self-pay | Admitting: Internal Medicine

## 2022-08-17 VITALS — Ht 66.0 in | Wt 274.0 lb

## 2022-08-17 DIAGNOSIS — J0110 Acute frontal sinusitis, unspecified: Secondary | ICD-10-CM | POA: Diagnosis not present

## 2022-08-17 MED ORDER — AZITHROMYCIN 250 MG PO TABS
ORAL_TABLET | ORAL | 0 refills | Status: AC
Start: 2022-08-17 — End: 2022-08-22

## 2022-08-17 MED ORDER — SALINE SPRAY 0.65 % NA SOLN: 2.0000 | Freq: Every day | NASAL | 2 refills | Status: AC

## 2022-08-17 NOTE — Progress Notes (Signed)
Virtual Visit via Video Note  I connected with Patricia Reynolds  on 08/17/22 at  8:20 AM EDT by a video enabled telemedicine application and verified that I am speaking with the correct person using two identifiers.  Location patient: [place] Location provider:work or home office Persons participating in the virtual visit: patient, provider  I discussed the limitations and requested verbal permission for telemedicine visit. The patient expressed understanding and agreed to proceed.   HPI:  Acute telemedicine visit for : Sinus pressure and pain frontal and maxillary with sore throat, tender glands has not tested for covid last week sinus pressure no fever, cough with phelgm today nothing tried   -Pertinent past medical history: see below -Pertinent medication allergies: Allergies  Allergen Reactions   Darvocet [Propoxyphene N-Acetaminophen] Nausea And Vomiting   Hydralazine     H/a ?   Imdur [Isosorbide Nitrate]     H/a   -COVID-19 vaccine status:  Immunization History  Administered Date(s) Administered   Influenza,inj,Quad PF,6+ Mos 08/23/2019   Influenza-Unspecified 09/25/2021     ROS: See pertinent positives and negatives per HPI.  Past Medical History:  Diagnosis Date   Bronchitis    11/2017    Chicken pox    Chronic combined systolic (congestive) and diastolic (congestive) heart failure ([place]) 03/14/2012   a. 02/2012 Echo: EF 20-25%, diff HK, Gr2 DD; b. 08/2012 Echo: EF 45-5%, mild LVH; c. 09/2019 Echo: EF 20%, sev dil LV, glob HK. Nl RV fxn. Mod dil LA.   COVID-19 virus infection 07/2019   Hypertensive heart disease    Hypertensive NICM (nonischemic cardiomyopathy) ([place])    a. 02/2012 Echo: EF 20-25%, diff HK, Gr2 DD; b. 03/2012 ETT: Ex time 6:11, max HR 171, No ST/T changes; c. 08/2012 Echo: EF 45-50%, mild LVH; d. 09/2019 Echo: EF 20%, sev dil LV, glob HK. Nl RV fxn. Mod dil LA.   Maternal anemia complicating pregnancy, childbirth, or the puerperium 09/22/2011   Morbid  obesity ([place])    Nonobstructive CAD (coronary artery disease)    a. 11/2019 Cor CTA: Cor Ca2+ 48.9 (98th%'ile). LM nl, LAD nl, LCX nl, RCA <50% prox/mid mixed plaque.    Past Surgical History:  Procedure Laterality Date   CESAREAN SECTION  09/21/2011   Procedure: CESAREAN SECTION;  Surgeon: Agnes Lawrence, MD;  Location: [facility];  Service: Gynecology;  Laterality: N/A;   FINGER SURGERY     right thumb repair 2006 playing Basketball      Current Outpatient Medications:    albuterol (VENTOLIN HFA) 108 (90 Base) MCG/ACT inhaler, Inhale 1-2 puffs into the lungs every 6 (six) hours as needed for wheezing or shortness of breath., Disp: 1 each, Rfl: 12   azithromycin (ZITHROMAX) 250 MG tablet, Take 2 tablets on day 1, then 1 tablet daily on days 2 through 5 with food, Disp: 6 tablet, Rfl: 0   carvedilol (COREG) 25 MG tablet, Take 1 tablet (25 mg total) by mouth 2 (two) times daily with a meal., Disp: 180 tablet, Rfl: 3   cetirizine (ZYRTEC) 10 MG tablet, Take 1 tablet (10 mg total) by mouth at bedtime as needed for allergies., Disp: 90 tablet, Rfl: 3   Cholecalciferol 1.25 MG (50000 UT) capsule, Take 1 capsule (50,000 Units total) by mouth once a week. D3 weekly x 6 months, Disp: 13 capsule, Rfl: 1   empagliflozin (JARDIANCE) 10 MG TABS tablet, Take by mouth daily., Disp: , Rfl:    sacubitril-valsartan (ENTRESTO) 97-103 MG, Take 1 tablet by mouth  2 (two) times daily., Disp: 60 tablet, Rfl: 6   sodium chloride (OCEAN) 0.65 % SOLN nasal spray, Place 2 sprays into both nostrils daily., Disp: 30 mL, Rfl: 2   spironolactone (ALDACTONE) 25 MG tablet, TAKE 1 TABLET BY MOUTH ONCE DAILY., Disp: 90 tablet, Rfl: 3   furosemide (LASIX) 20 MG tablet, Take 1 tablet (20 mg total) by mouth daily as needed for fluid or edema. (Patient not taking: Reported on 08/17/2022), Disp: 30 tablet, Rfl: 3   neomycin-polymyxin-hydrocortisone (CORTISPORIN) OTIC solution, Place 3 drops into both ears 3 (three) times daily.  X4-7 days (Patient not taking: Reported on 08/17/2022), Disp: 10 mL, Rfl: 0   rosuvastatin (CRESTOR) 10 MG tablet, Take 1 tablet (10 mg total) by mouth daily. (Patient not taking: Reported on 08/17/2022), Disp: 30 tablet, Rfl: 5  EXAM:  VITALS per patient if applicable:  GENERAL: alert, oriented, appears well and in no acute distress  HEENT: atraumatic, conjunttiva clear, no obvious abnormalities on inspection of external nose and ears  NECK: normal movements of the head and neck  LUNGS: on inspection no signs of respiratory distress, breathing rate appears normal, no obvious gross SOB, gasping or wheezing  CV: no obvious cyanosis  MS: moves all visible extremities without noticeable abnormality  PSYCH/NEURO: pleasant and cooperative, no obvious depression or anxiety, speech and thought processing grossly intact  ASSESSMENT AND PLAN:  Discussed the following assessment and plan:  Acute non-recurrent frontal sinusitis - Plan: azithromycin (ZITHROMAX) 250 MG tablet, sodium chloride (OCEAN) 0.65 % SOLN nasal spray  HM Had flu shot late 11/(3 or 4)/2022 at work Tdap 09/2017 check date Hep B immune  Pap 11/06/21 neg neg HPV Covid shots had moderna 2/2  Had polio vaccines, MMR, Hib colonoscopy 11  Mammo age 24   -we discussed possible serious and likely etiologies, options for evaluation and workup, limitations of telemedicine visit vs in person visit, treatment, treatment risks and precautions. Pt is agreeable to treatment via telemedicine at this moment.   Advised to seek prompt virtual visit or in person care if worsening, new symptoms arise, or if is not improving with treatment as expected per our conversation of expected course. Discussed options for follow up care. Did let this patient know that I do telemedicine on Tuesdays and Thursdays for [facility] and those are the days I am logged into the system. Advised to schedule follow up visit with PCP, [place] virtual visits or [facility]  if any further questions or concerns to avoid delays in care.   I discussed the assessment and treatment plan with the patient. The patient was provided an opportunity to ask questions and all were answered. The patient agreed with the plan and demonstrated an understanding of the instructions.    Time spent 20 minutes Delorise Jackson, MD

## 2022-08-20 ENCOUNTER — Ambulatory Visit: Payer: BC Managed Care – PPO | Admitting: Internal Medicine

## 2022-08-23 ENCOUNTER — Other Ambulatory Visit: Payer: Self-pay | Admitting: Internal Medicine

## 2022-08-23 ENCOUNTER — Encounter: Payer: Self-pay | Admitting: Internal Medicine

## 2022-08-23 ENCOUNTER — Ambulatory Visit: Payer: BC Managed Care – PPO | Admitting: Internal Medicine

## 2022-08-23 VITALS — BP 128/70 | HR 72 | Temp 98.4°F | Ht 66.0 in | Wt 270.8 lb

## 2022-08-23 DIAGNOSIS — I5022 Chronic systolic (congestive) heart failure: Secondary | ICD-10-CM | POA: Diagnosis not present

## 2022-08-23 DIAGNOSIS — E538 Deficiency of other specified B group vitamins: Secondary | ICD-10-CM

## 2022-08-23 DIAGNOSIS — E785 Hyperlipidemia, unspecified: Secondary | ICD-10-CM

## 2022-08-23 DIAGNOSIS — R7303 Prediabetes: Secondary | ICD-10-CM

## 2022-08-23 DIAGNOSIS — I428 Other cardiomyopathies: Secondary | ICD-10-CM

## 2022-08-23 DIAGNOSIS — Z23 Encounter for immunization: Secondary | ICD-10-CM

## 2022-08-23 DIAGNOSIS — I10 Essential (primary) hypertension: Secondary | ICD-10-CM

## 2022-08-23 DIAGNOSIS — I517 Cardiomegaly: Secondary | ICD-10-CM

## 2022-08-23 DIAGNOSIS — E559 Vitamin D deficiency, unspecified: Secondary | ICD-10-CM

## 2022-08-23 LAB — CBC WITH DIFFERENTIAL/PLATELET
Basophils Absolute: 0.1 10*3/uL (ref 0.0–0.1)
Basophils Relative: 0.6 % (ref 0.0–3.0)
Eosinophils Absolute: 0.2 10*3/uL (ref 0.0–0.7)
Eosinophils Relative: 1.9 % (ref 0.0–5.0)
HCT: 37.7 % (ref 36.0–46.0)
Hemoglobin: 12.5 g/dL (ref 12.0–15.0)
Lymphocytes Relative: 24.8 % (ref 12.0–46.0)
Lymphs Abs: 2.2 10*3/uL (ref 0.7–4.0)
MCHC: 33.2 g/dL (ref 30.0–36.0)
MCV: 82.1 fl (ref 78.0–100.0)
Monocytes Absolute: 0.6 10*3/uL (ref 0.1–1.0)
Monocytes Relative: 6.7 % (ref 3.0–12.0)
Neutro Abs: 5.8 10*3/uL (ref 1.4–7.7)
Neutrophils Relative %: 66 % (ref 43.0–77.0)
Platelets: 357 10*3/uL (ref 150.0–400.0)
RBC: 4.6 Mil/uL (ref 3.87–5.11)
RDW: 13.9 % (ref 11.5–15.5)
WBC: 8.7 10*3/uL (ref 4.0–10.5)

## 2022-08-23 LAB — LIPID PANEL
Cholesterol: 212 mg/dL — ABNORMAL HIGH (ref 0–200)
HDL: 49.9 mg/dL (ref >39.00)
LDL Cholesterol: 141 mg/dL — ABNORMAL HIGH (ref 0–99)
NonHDL: 162.08
Total CHOL/HDL Ratio: 4
Triglycerides: 105 mg/dL (ref 0.0–149.0)
VLDL: 21 mg/dL (ref 0.0–40.0)

## 2022-08-23 LAB — COMPREHENSIVE METABOLIC PANEL
ALT: 11 U/L (ref 0–35)
AST: 16 U/L (ref 0–37)
Albumin: 4.2 g/dL (ref 3.5–5.2)
Alkaline Phosphatase: 62 U/L (ref 39–117)
BUN: 11 mg/dL (ref 6–23)
CO2: 26 mEq/L (ref 19–32)
Calcium: 10 mg/dL (ref 8.4–10.5)
Chloride: 101 mEq/L (ref 96–112)
Creatinine, Ser: 0.8 mg/dL (ref 0.40–1.20)
GFR: 95.15 mL/min (ref >60.00)
Glucose, Bld: 97 mg/dL (ref 70–99)
Potassium: 4.4 mEq/L (ref 3.5–5.1)
Sodium: 135 mEq/L (ref 135–145)
Total Bilirubin: 0.7 mg/dL (ref 0.2–1.2)
Total Protein: 7.3 g/dL (ref 6.0–8.3)

## 2022-08-23 LAB — VITAMIN D 25 HYDROXY (VIT D DEFICIENCY, FRACTURES): VITD: 21.33 ng/mL — ABNORMAL LOW (ref 30.00–100.00)

## 2022-08-23 LAB — VITAMIN B12: Vitamin B-12: 216 pg/mL (ref 211–911)

## 2022-08-23 LAB — HEMOGLOBIN A1C: Hgb A1c MFr Bld: 6.1 % (ref 4.6–6.5)

## 2022-08-23 MED ORDER — ROSUVASTATIN CALCIUM 10 MG PO TABS: 10.0000 mg | ORAL_TABLET | Freq: Every day | ORAL | 3 refills | Status: AC

## 2022-08-23 MED ORDER — CHOLECALCIFEROL 1.25 MG (50000 UT) PO CAPS: 50000.0000 [IU] | ORAL_CAPSULE | ORAL | 1 refills | Status: AC

## 2022-08-23 NOTE — Progress Notes (Signed)
Chief Complaint  Patient presents with   Follow-up    6 month f/u   F/u  1. Htn controlled and chronic systolic CHF on coreg 25 mg bid, jardiance 10 and crestor 10 and entresto 97-103 bid and spironlactone 25  2. Prediabetes asks about injections wegovy and saxenda reviewed A1c 6.1  3. Hld restarted crestor since last visit  4. Flu shot given today    Review of Systems  Constitutional:  Negative for weight loss.  HENT:  Negative for hearing loss.   Eyes:  Negative for blurred vision.  Respiratory:  Negative for shortness of breath.   Cardiovascular:  Negative for chest pain.  Gastrointestinal:  Negative for abdominal pain and blood in stool.  Genitourinary:  Negative for dysuria.  Musculoskeletal:  Negative for falls and joint pain.  Skin:  Negative for rash.  Neurological:  Negative for headaches.  Psychiatric/Behavioral:  Negative for depression.    Past Medical History:  Diagnosis Date   Bronchitis    11/2017    Chicken pox    Chronic combined systolic (congestive) and diastolic (congestive) heart failure ([place]) 03/14/2012   a. 02/2012 Echo: EF 20-25%, diff HK, Gr2 DD; b. 08/2012 Echo: EF 45-5%, mild LVH; c. 09/2019 Echo: EF 20%, sev dil LV, glob HK. Nl RV fxn. Mod dil LA.   COVID-19 virus infection 07/2019   Hypertensive heart disease    Hypertensive NICM (nonischemic cardiomyopathy) ([place])    a. 02/2012 Echo: EF 20-25%, diff HK, Gr2 DD; b. 03/2012 ETT: Ex time 6:11, max HR 171, No ST/T changes; c. 08/2012 Echo: EF 45-50%, mild LVH; d. 09/2019 Echo: EF 20%, sev dil LV, glob HK. Nl RV fxn. Mod dil LA.   Maternal anemia complicating pregnancy, childbirth, or the puerperium 09/22/2011   Morbid obesity ([place])    Nonobstructive CAD (coronary artery disease)    a. 11/2019 Cor CTA: Cor Ca2+ 48.9 (98th%'ile). LM nl, LAD nl, LCX nl, RCA <50% prox/mid mixed plaque.   Past Surgical History:  Procedure Laterality Date   CESAREAN SECTION  09/21/2011   Procedure: CESAREAN SECTION;   Surgeon: Agnes Lawrence, MD;  Location: [facility];  Service: Gynecology;  Laterality: N/A;   FINGER SURGERY     right thumb repair 2006 playing Basketball    Family History  Problem Relation Age of Onset   Hypertension Mother    Diabetes type II Mother    Diabetes Mother    Hypertension Father    Hyperlipidemia Father    Depression Maternal Grandmother    Stroke Paternal Grandmother    Anesthesia problems Neg Hx    Hypotension Neg Hx    Malignant hyperthermia Neg Hx    Pseudochol deficiency Neg Hx    Heart attack Neg Hx    Social History   Socioeconomic History   Marital status: Single    Spouse name: Not on file   Number of children: 1   Years of education: Not on file   Highest education level: Not on file  Occupational History   Not on file  Tobacco Use   Smoking status: Never   Smokeless tobacco: Never  Vaping Use   Vaping Use: Never used  Substance and Sexual Activity   Alcohol use: No   Drug use: No   Sexual activity: Yes  Other Topics Concern   Not on file  Social History Narrative   Lives in [place], [place] with boyfriend.    1 son 74 y.o    Works at [employer]  Wears seatbelt    Feels safe in relationship    No guns    Social Determinants of Health   Financial Resource Strain: Not on file  Food Insecurity: Not on file  Transportation Needs: Not on file  Physical Activity: Not on file  Stress: Not on file  Social Connections: Not on file  Intimate Partner Violence: Not on file   Current Meds  Medication Sig   albuterol (VENTOLIN HFA) 108 (90 Base) MCG/ACT inhaler Inhale 1-2 puffs into the lungs every 6 (six) hours as needed for wheezing or shortness of breath.   carvedilol (COREG) 25 MG tablet Take 1 tablet (25 mg total) by mouth 2 (two) times daily with a meal.   cetirizine (ZYRTEC) 10 MG tablet Take 1 tablet (10 mg total) by mouth at bedtime as needed for allergies.   Cholecalciferol 1.25 MG (50000 UT) capsule Take 1 capsule (50,000 Units total) by  mouth once a week. D3 weekly x 6 months   empagliflozin (JARDIANCE) 10 MG TABS tablet Take by mouth daily.   sacubitril-valsartan (ENTRESTO) 97-103 MG Take 1 tablet by mouth 2 (two) times daily.   sodium chloride (OCEAN) 0.65 % SOLN nasal spray Place 2 sprays into both nostrils daily.   spironolactone (ALDACTONE) 25 MG tablet TAKE 1 TABLET BY MOUTH ONCE DAILY.   [DISCONTINUED] rosuvastatin (CRESTOR) 10 MG tablet Take 1 tablet (10 mg total) by mouth daily.   Allergies  Allergen Reactions   Darvocet [Propoxyphene N-Acetaminophen] Nausea And Vomiting   Hydralazine     H/a ?   Imdur [Isosorbide Nitrate]     H/a   No results found for this or any previous visit (from the past 2160 hour(s)). Objective  Body mass index is 43.71 kg/m. Wt Readings from Last 3 Encounters:  08/23/22 270 lb 12.8 oz (122.8 kg)  08/17/22 274 lb (124.3 kg)  02/17/22 274 lb 6.4 oz (124.5 kg)   Temp Readings from Last 3 Encounters:  08/23/22 98.4 F (36.9 C) (Oral)  02/17/22 98 F (36.7 C) (Oral)  11/06/21 (!) 97.1 F (36.2 C) (Temporal)   BP Readings from Last 3 Encounters:  08/23/22 128/70  02/17/22 110/78  11/06/21 132/88   Pulse Readings from Last 3 Encounters:  08/23/22 72  02/17/22 65  11/06/21 77    Physical Exam Vitals and nursing note reviewed.  Constitutional:      Appearance: Normal appearance. She is well-developed and well-groomed.  HENT:     Head: Normocephalic and atraumatic.  Eyes:     Conjunctiva/sclera: Conjunctivae normal.     Pupils: Pupils are equal, round, and reactive to light.  Cardiovascular:     Rate and Rhythm: Normal rate and regular rhythm.     Heart sounds: Normal heart sounds. No murmur heard. Pulmonary:     Effort: Pulmonary effort is normal.     Breath sounds: Normal breath sounds.  Abdominal:     General: Abdomen is flat. Bowel sounds are normal.     Tenderness: There is no abdominal tenderness.  Musculoskeletal:        General: No tenderness.  Skin:     General: Skin is warm and dry.  Neurological:     General: No focal deficit present.     Mental Status: She is alert and oriented to person, place, and time. Mental status is at baseline.     Cranial Nerves: Cranial nerves 2-12 are intact.     Motor: Motor function is intact.     Coordination: Coordination  is intact.     Gait: Gait is intact.  Psychiatric:        Attention and Perception: Attention and perception normal.        Mood and Affect: Mood and affect normal.        Speech: Speech normal.        Behavior: Behavior normal. Behavior is cooperative.        Thought Content: Thought content normal.        Cognition and Memory: Cognition and memory normal.        Judgment: Judgment normal.     Assessment  Plan  Chronic systolic heart failure (HCC)/HTN Stable coreg 25 mg bid, jardiance 10 and crestor 10 and entresto 97-103 bid and spironlactone 25   Hyperlipidemia, unspecified hyperlipidemia type - Plan: Lipid panel, Comprehensive metabolic panel, CBC with Differential/Platelet  Vitamin D deficiency - Plan: Vitamin D (25 hydroxy)  Prediabetes - Plan: Hemoglobin A1c  Vitamin B12 deficiency - Plan: Vitamin B12   HM Had flu shot given today  Tdap 09/2017 check date Hep B immune  Pap 11/06/21 neg neg HPV Covid shots had moderna 2/2  Had polio vaccines, MMR, Hib colonoscopy 54  Mammo age 64  Last echo 06/2021    Provider: Dr. Olivia Mackie McLean-Scocuzza-Internal Medicine

## 2022-08-23 NOTE — Patient Instructions (Addendum)
Check date with employee health of [employer] vaccine   Please repeat echo at next appt    Debrox ear wax drops    Liraglutide Injection (Weight Management) What is this medication? LIRAGLUTIDE (LIR a GLOO tide) promotes weight loss. It may also be used to maintain weight loss. It works by decreasing appetite. Changes to diet and exercise are often combined with this medication. This medicine may be used for other purposes; ask your health care provider or pharmacist if you have questions. COMMON BRAND NAME(S): Saxenda What should I tell my care team before I take this medication? They need to know if you have any of these conditions: Endocrine tumors (MEN 2) or if someone in your family had these tumors Gallbladder disease High cholesterol History of alcohol abuse problem History of pancreatitis Kidney disease or if you are on dialysis Liver disease Previous swelling of the tongue, face, or lips with difficulty breathing, difficulty swallowing, hoarseness, or tightening of the throat Stomach problems Suicidal thoughts, plans, or attempt; a previous suicide attempt by you or a family member Thyroid cancer or if someone in your family had thyroid cancer An unusual or allergic reaction to liraglutide, other medications, foods, dyes, or preservatives Pregnant or trying to get pregnant Breast-feeding How should I use this medication? This medication is for injection under the skin of your upper leg, stomach area, or upper arm. You will be taught how to prepare and give this medication. Use exactly as directed. Take your medication at regular intervals. Do not take it more often than directed. This medication comes with INSTRUCTIONS FOR USE. Ask your pharmacist for directions on how to use this medication. Read the information carefully. Talk to your pharmacist or care team if you have questions. It is important that you put your used needles and syringes in a special sharps container. Do not  put them in a trash can. If you do not have a sharps container, call your pharmacist or care team to get one. A special MedGuide will be given to you by the pharmacist with each prescription and refill. Be sure to read this information carefully each time. Talk to your care team about the use of this medication in children. While it may be prescribed for children as young as 72 years of age for selected conditions, precautions do apply. Overdosage: If you think you have taken too much of this medicine contact a poison control center or emergency room at once. NOTE: This medicine is only for you. Do not share this medicine with others. What if I miss a dose? If you miss a dose, take it as soon as you can. If it is almost time for your next dose, take only that dose. Do not take double or extra doses. If you miss your dose for 3 days or more, call your care team to talk about how to restart this medicine. What may interact with this medication? Insulin and other medications for diabetes This list may not describe all possible interactions. Give your health care provider a list of all the medicines, herbs, non-prescription drugs, or dietary supplements you use. Also tell them if you smoke, drink alcohol, or use illegal drugs. Some items may interact with your medicine. What should I watch for while using this medication? Visit your care team for regular checks on your progress. Drink plenty of fluids while taking this medication. Check with your care team if you get an attack of severe diarrhea, nausea, and vomiting. The loss of  too much body fluid can make it dangerous for you to take this medication. This medication may affect blood sugar levels. Ask your care team if changes in diet or medications are needed if you have diabetes. Patients and their families should watch out for worsening depression or thoughts of suicide. Also watch out for sudden changes in feelings such as feeling anxious, agitated,  panicky, irritable, hostile, aggressive, impulsive, severely restless, overly excited and hyperactive, or not being able to sleep. If this happens, especially at the beginning of treatment or after a change in dose, call your care team. Women should inform their care team if they wish to become pregnant or think they might be pregnant. Losing weight while pregnant is not advised and may cause harm to the unborn child. Talk to your care team for more information. What side effects may I notice from receiving this medication? Side effects that you should report to your care team as soon as possible: Allergic reactions or angioedema--skin rash, itching, hives, swelling of the face, eyes, lips, tongue, arms, or legs, trouble swallowing or breathing Fast or irregular heartbeat Gallbladder problems--severe stomach pain, nausea, vomiting, fever Kidney injury--decrease in the amount of urine, swelling of the ankles, hands, or feet Pancreatitis--severe stomach pain that spreads to your back or gets worse after eating or when touched, fever, nausea, vomiting Thoughts of suicide or self-harm, worsening mood, feelings of depression Thyroid cancer--new mass or lump in the neck, pain or trouble swallowing, trouble breathing, hoarseness Side effects that usually do not require medical attention (report to your care team if they continue or are bothersome): Constipation Dizziness Fatigue Headache Loss of Appetite Nausea Upset stomach This list may not describe all possible side effects. Call your doctor for medical advice about side effects. You may report side effects to FDA at 1-800-FDA-1088. Where should I keep my medication? Keep out of the reach of children and pets. Store unopened pen in a refrigerator between 2 and 8 degrees C (36 and 46 degrees F). Do not freeze or use if the medication has been frozen. Protect from light and excessive heat. After you first use the pen, it can be stored at room  temperature between 15 and 30 degrees C (59 and 86 degrees F) or in a refrigerator. Throw away your used pen after 30 days or after the expiration date, whichever comes first. Do not store your pen with the needle attached. If the needle is left on, medication may leak from the pen. NOTE: This sheet is a summary. It may not cover all possible information. If you have questions about this medicine, talk to your doctor, pharmacist, or health care provider.  2023 Elsevier/Gold Standard (2020-12-12 00:00:00)   Semaglutide Injection (Weight Management) What is this medication? SEMAGLUTIDE (SEM a GLOO tide) promotes weight loss. It may also be used to maintain weight loss. It works by decreasing appetite. Changes to diet and exercise are often combined with this medication. This medicine may be used for other purposes; ask your health care provider or pharmacist if you have questions. COMMON BRAND NAME(S): CHENID What should I tell my care team before I take this medication? They need to know if you have any of these conditions: Endocrine tumors (MEN 2) or if someone in your family had these tumors Eye disease, vision problems Gallbladder disease History of depression or mental health disease History of pancreatitis Kidney disease Stomach or intestine problems Suicidal thoughts, plans, or attempt; a previous suicide attempt by you or a  family member Thyroid cancer or if someone in your family had thyroid cancer An unusual or allergic reaction to semaglutide, other medications, foods, dyes, or preservatives Pregnant or trying to get pregnant Breast-feeding How should I use this medication? This medication is injected under the skin. You will be taught how to prepare and give it. Take it as directed on the prescription label. It is given once every week (every 7 days). Keep taking it unless your care team tells you to stop. It is important that you put your used needles and pens in a special  sharps container. Do not put them in a trash can. If you do not have a sharps container, call your pharmacist or care team to get one. A special MedGuide will be given to you by the pharmacist with each prescription and refill. Be sure to read this information carefully each time. This medication comes with INSTRUCTIONS FOR USE. Ask your pharmacist for directions on how to use this medication. Read the information carefully. Talk to your pharmacist or care team if you have questions. Talk to your care team about the use of this medication in children. While it may be prescribed for children as young as 12 years for selected conditions, precautions do apply. Overdosage: If you think you have taken too much of this medicine contact a poison control center or emergency room at once. NOTE: This medicine is only for you. Do not share this medicine with others. What if I miss a dose? If you miss a dose and the next scheduled dose is more than 2 days away, take the missed dose as soon as possible. If you miss a dose and the next scheduled dose is less than 2 days away, do not take the missed dose. Take the next dose at your regular time. Do not take double or extra doses. If you miss your dose for 2 weeks or more, take the next dose at your regular time or call your care team to talk about how to restart this medication. What may interact with this medication? Insulin and other medications for diabetes This list may not describe all possible interactions. Give your health care provider a list of all the medicines, herbs, non-prescription drugs, or dietary supplements you use. Also tell them if you smoke, drink alcohol, or use illegal drugs. Some items may interact with your medicine. What should I watch for while using this medication? Visit your care team for regular checks on your progress. It may be some time before you see the benefit from this medication. Drink plenty of fluids while taking this  medication. Check with your care team if you have severe diarrhea, nausea, and vomiting, or if you sweat a lot. The loss of too much body fluid may make it dangerous for you to take this medication. This medication may affect blood sugar levels. Ask your care team if changes in diet or medications are needed if you have diabetes. If you or your family notice any changes in your behavior, such as new or worsening depression, thoughts of harming yourself, anxiety, other unusual or disturbing thoughts, or memory loss, call your care team right away. Women should inform their care team if they wish to become pregnant or think they might be pregnant. Losing weight while pregnant is not advised and may cause harm to the unborn child. Talk to your care team for more information. What side effects may I notice from receiving this medication? Side effects that you should report  to your care team as soon as possible: Allergic reactions--skin rash, itching, hives, swelling of the face, lips, tongue, or throat Change in vision Dehydration--increased thirst, dry mouth, feeling faint or lightheaded, headache, dark yellow or brown urine Gallbladder problems--severe stomach pain, nausea, vomiting, fever Heart palpitations--rapid, pounding, or irregular heartbeat Kidney injury--decrease in the amount of urine, swelling of the ankles, hands, or feet Pancreatitis--severe stomach pain that spreads to your back or gets worse after eating or when touched, fever, nausea, vomiting Thoughts of suicide or self-harm, worsening mood, feelings of depression Thyroid cancer--new mass or lump in the neck, pain or trouble swallowing, trouble breathing, hoarseness Side effects that usually do not require medical attention (report to your care team if they continue or are bothersome): Diarrhea Loss of appetite Nausea Stomach pain Vomiting This list may not describe all possible side effects. Call your doctor for medical advice  about side effects. You may report side effects to FDA at 1-800-FDA-1088. Where should I keep my medication? Keep out of the reach of children and pets. Refrigeration (preferred): Store in the refrigerator. Do not freeze. Keep this medication in the original container until you are ready to take it. Get rid of any unused medication after the expiration date. Room temperature: If needed, prior to cap removal, the pen can be stored at room temperature for up to 28 days. Protect from light. If it is stored at room temperature, get rid of any unused medication after 28 days or after it expires, whichever is first. It is important to get rid of the medication as soon as you no longer need it or it is expired. You can do this in two ways: Take the medication to a medication take-back program. Check with your pharmacy or law enforcement to find a location. If you cannot return the medication, follow the directions in the MedGuide. NOTE: This sheet is a summary. It may not cover all possible information. If you have questions about this medicine, talk to your doctor, pharmacist, or health care provider.  2023 Elsevier/Gold Standard (2021-01-22 00:00:00)

## 2022-08-24 ENCOUNTER — Telehealth: Payer: Self-pay

## 2022-08-24 NOTE — Telephone Encounter (Signed)
LMOM for pt to CB in regards to labs 

## 2022-08-30 ENCOUNTER — Telehealth: Payer: Self-pay

## 2022-08-30 NOTE — Telephone Encounter (Signed)
LMOM for pt to CB in regards to labs 

## 2022-09-01 ENCOUNTER — Telehealth: Payer: Self-pay

## 2022-09-01 NOTE — Telephone Encounter (Signed)
This is my 3rd attempt at trying to contact pt in regards to labs Dr. Olivia Mackie had a question for the pt that we needed to discuss. Pt saw labs on MyChart but Dr. Olivia Mackie would like a response to the question.   A letter will be going out with this question and for pt to CB with response.     Cholesterol improved but goal total <200 and ldl <100  -she she agreeable to increase crestor to 20 mg ? Can take 2 10 mg pills  F/u cardiology  Rec healthy diet and exercise  Vitamin D low sent to pharmacy  B12 low normal rec B12 1000 mcg daily otc  Liver kidneys normal  Blood counts normal

## 2024-01-04 ENCOUNTER — Telehealth: Payer: Self-pay

## 2024-01-04 NOTE — Telephone Encounter (Signed)
We received a fax from [facility] for patient.  I noticed that patient has not been seen here since she saw Dr. French Ana McLean-Scocuzza, who is no longer with our practice.  I called patient to find out if she would like to continue with  for her primary care or if she is planning to seek primary care elsewhere.  Patient states she has made an appointment to establish with a provider at [facility].  Patient states she works in [place], so this will be more convenient for her.  I let patient know that I will note this in her chart.  Patient states she will contact [facility] to let them know she is switching her primary care to [facility].
